# Patient Record
Sex: Female | Born: 1985 | Race: Black or African American | Hispanic: No | Marital: Single | Smoking: Never smoker
Health system: Southern US, Community
[De-identification: ages and names within clinical notes are randomized; demographics above are authoritative.]

## PROBLEM LIST (undated history)

## (undated) ENCOUNTER — Inpatient Hospital Stay (HOSPITAL_COMMUNITY): Payer: Self-pay

## (undated) DIAGNOSIS — R51 Headache: Secondary | ICD-10-CM

## (undated) DIAGNOSIS — Z8619 Personal history of other infectious and parasitic diseases: Secondary | ICD-10-CM

## (undated) DIAGNOSIS — R519 Headache, unspecified: Secondary | ICD-10-CM

## (undated) DIAGNOSIS — Z87898 Personal history of other specified conditions: Secondary | ICD-10-CM

## (undated) DIAGNOSIS — F1291 Cannabis use, unspecified, in remission: Secondary | ICD-10-CM

## (undated) DIAGNOSIS — R87629 Unspecified abnormal cytological findings in specimens from vagina: Secondary | ICD-10-CM

## (undated) HISTORY — PX: THERAPEUTIC ABORTION: SHX798

## (undated) HISTORY — PX: TONSILLECTOMY AND ADENOIDECTOMY: SHX28

## (undated) HISTORY — PX: KNEE ARTHROCENTESIS: SUR44

---

## 2007-03-13 ENCOUNTER — Other Ambulatory Visit: Admission: RE | Admit: 2007-03-13 | Discharge: 2007-03-13 | Payer: Self-pay | Admitting: Gynecology

## 2007-07-04 ENCOUNTER — Other Ambulatory Visit: Admission: RE | Admit: 2007-07-04 | Discharge: 2007-07-04 | Payer: Self-pay | Admitting: Gynecology

## 2007-12-03 ENCOUNTER — Other Ambulatory Visit: Admission: RE | Admit: 2007-12-03 | Discharge: 2007-12-03 | Payer: Self-pay | Admitting: Gynecology

## 2008-04-17 ENCOUNTER — Inpatient Hospital Stay (HOSPITAL_COMMUNITY): Admission: AD | Admit: 2008-04-17 | Discharge: 2008-04-17 | Payer: Self-pay | Admitting: Obstetrics & Gynecology

## 2009-10-09 ENCOUNTER — Emergency Department (HOSPITAL_COMMUNITY): Admission: EM | Admit: 2009-10-09 | Discharge: 2009-10-10 | Payer: Self-pay | Admitting: Emergency Medicine

## 2010-06-07 LAB — CBC
HCT: 40.8 % (ref 36.0–46.0)
Hemoglobin: 13.2 g/dL (ref 12.0–15.0)
MCHC: 32.5 g/dL (ref 30.0–36.0)
MCV: 89.6 fL (ref 78.0–100.0)
RBC: 4.55 MIL/uL (ref 3.87–5.11)
RDW: 13.8 % (ref 11.5–15.5)
WBC: 13.6 10*3/uL — ABNORMAL HIGH (ref 4.0–10.5)

## 2010-06-07 LAB — WET PREP, GENITAL: Yeast Wet Prep HPF POC: NONE SEEN

## 2010-06-07 LAB — POCT PREGNANCY, URINE: Preg Test, Ur: POSITIVE

## 2010-06-07 LAB — URINALYSIS, ROUTINE W REFLEX MICROSCOPIC
Bilirubin Urine: NEGATIVE
Glucose, UA: NEGATIVE mg/dL
Hgb urine dipstick: NEGATIVE
Protein, ur: NEGATIVE mg/dL
Specific Gravity, Urine: 1.025 (ref 1.005–1.030)
pH: 5.5 (ref 5.0–8.0)

## 2010-06-07 LAB — GC/CHLAMYDIA PROBE AMP, GENITAL: GC Probe Amp, Genital: NEGATIVE

## 2011-07-10 ENCOUNTER — Other Ambulatory Visit: Payer: Self-pay | Admitting: Gynecology

## 2011-07-10 DIAGNOSIS — N63 Unspecified lump in unspecified breast: Secondary | ICD-10-CM

## 2011-07-11 ENCOUNTER — Ambulatory Visit
Admission: RE | Admit: 2011-07-11 | Discharge: 2011-07-11 | Disposition: A | Discharge status: Home / Self Care | Payer: BC Managed Care – PPO | Source: Ambulatory Visit | Attending: Gynecology | Admitting: Gynecology

## 2011-07-11 DIAGNOSIS — N63 Unspecified lump in unspecified breast: Secondary | ICD-10-CM

## 2011-07-21 ENCOUNTER — Ambulatory Visit: Payer: BC Managed Care – PPO | Admitting: Family Medicine

## 2011-07-21 VITALS — BP 128/78 | HR 96 | Temp 98.7°F | Resp 20 | Ht 66.0 in | Wt 235.0 lb

## 2011-07-21 DIAGNOSIS — E669 Obesity, unspecified: Secondary | ICD-10-CM

## 2011-07-21 DIAGNOSIS — Z0000 Encounter for general adult medical examination without abnormal findings: Secondary | ICD-10-CM

## 2011-07-21 LAB — POCT CBC
Granulocyte percent: 42.9 %G (ref 37–80)
MCHC: 31.3 g/dL — AB (ref 31.8–35.4)
MID (cbc): 0.6 (ref 0–0.9)
MPV: 12.6 fL (ref 0–99.8)
RDW, POC: 13 %
WBC: 9.6 10*3/uL (ref 4.6–10.2)

## 2011-07-21 MED ORDER — NABUMETONE 750 MG PO TABS: 750.0000 mg | ORAL_TABLET | Freq: Two times a day (BID) | ORAL | Status: AC

## 2011-07-21 NOTE — Progress Notes (Signed)
  Subjective: Patient is here for physical examination. Please refer to her patient history sheet which we had scanned into this record which has her past medical history family history social history review of systems all documented. I reviewed this all with the patient.  Her left ankle is been hurting her on the top of the foot actually. She says it 1 para shoes bothers her the most on that. She works long hours standing on a hard floor.  Objective: Obese Afro-American female in no acute distress this time. She is pleasant and oriented. Her TMs are normal. Eyes PERRLA. Fundi benign. Throat clear. Neck supple without nodes or thyromegaly. No carotid bruits. Chest clear to auscultation. Heart has a grade 1-2/6 systolic murmur heard on the right upper sternal border and left upper sternal border. Appear both seated and laying down. Heart was regular. Abdomen soft and mass or tenderness. No ankle edema. Extremities are otherwise unremarkable. She is a little tender area anterior to the medial malleolus of the left foot.    Results for orders placed in visit on 07/21/11  POCT CBC      Component Value Range   WBC 9.6  4.6 - 10.2 (K/uL)   Lymph, poc 4.9 (*) 0.6 - 3.4    POC LYMPH PERCENT 50.8 (*) 10 - 50 (%L)   MID (cbc) 0.6  0 - 0.9    POC MID % 6.3  0 - 12 (%M)   POC Granulocyte 4.1  2 - 6.9    Granulocyte percent 42.9  37 - 80 (%G)   RBC 4.86  4.04 - 5.48 (M/uL)   Hemoglobin 13.7  12.2 - 16.2 (g/dL)   HCT, POC 82.9  56.2 - 47.9 (%)   MCV 90.1  80 - 97 (fL)   MCH, POC 28.2  27 - 31.2 (pg)   MCHC 31.3 (*) 31.8 - 35.4 (g/dL)   RDW, POC 13.0     Platelet Count, POC 189  142 - 424 (K/uL)   MPV 12.6  0 - 99.8 (fL)   Other labs are pending:  Assessment: Complete physical examination Obesity Systolic murmur Foot pain  Plan: Relafen 750 twice a day for the foot pain. Advised her heart recheck in 3 months to see if the murmur is still there. It may just be a transient thing. If it is  persisting we'll have to get echo on her. I think it is benign. Discussed with her the need for weight loss and how to work on going about it. She needs to get honest with himself, decreased oral intake, and increased exercise.

## 2011-07-21 NOTE — Patient Instructions (Signed)
Consider wearing arch supports.  Work hard on weight loss.  Return if problems persist.

## 2011-07-22 LAB — LIPID PANEL
Cholesterol: 200 mg/dL (ref 0–200)
HDL: 51 mg/dL (ref >39)
VLDL: 22 mg/dL (ref 0–40)

## 2011-07-22 LAB — COMPREHENSIVE METABOLIC PANEL
Alkaline Phosphatase: 58 U/L (ref 39–117)
BUN: 14 mg/dL (ref 6–23)
Chloride: 105 mEq/L (ref 96–112)
Creat: 0.85 mg/dL (ref 0.50–1.10)
Glucose, Bld: 80 mg/dL (ref 70–99)
Total Protein: 7.4 g/dL (ref 6.0–8.3)

## 2011-07-24 ENCOUNTER — Encounter (INDEPENDENT_AMBULATORY_CARE_PROVIDER_SITE_OTHER): Payer: BC Managed Care – PPO

## 2011-07-24 DIAGNOSIS — Z111 Encounter for screening for respiratory tuberculosis: Secondary | ICD-10-CM

## 2011-07-25 ENCOUNTER — Encounter: Payer: Self-pay | Admitting: Family Medicine

## 2011-07-29 ENCOUNTER — Encounter: Payer: Self-pay | Admitting: Family Medicine
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
## 2014-11-16 LAB — OB RESULTS CONSOLE ABO/RH: RH TYPE: POSITIVE

## 2014-11-16 LAB — OB RESULTS CONSOLE HEPATITIS B SURFACE ANTIGEN: Hepatitis B Surface Ag: NEGATIVE

## 2014-11-16 LAB — OB RESULTS CONSOLE RPR: RPR: NONREACTIVE

## 2014-11-16 LAB — OB RESULTS CONSOLE GC/CHLAMYDIA
CHLAMYDIA, DNA PROBE: NEGATIVE
Gonorrhea: NEGATIVE

## 2014-11-16 LAB — OB RESULTS CONSOLE RUBELLA ANTIBODY, IGM: Rubella: IMMUNE

## 2014-11-16 LAB — OB RESULTS CONSOLE HIV ANTIBODY (ROUTINE TESTING): HIV: NONREACTIVE

## 2014-11-16 LAB — OB RESULTS CONSOLE ANTIBODY SCREEN: Antibody Screen: NEGATIVE

## 2015-01-24 ENCOUNTER — Encounter (HOSPITAL_COMMUNITY): Payer: Self-pay

## 2015-01-24 ENCOUNTER — Inpatient Hospital Stay (HOSPITAL_COMMUNITY)
Admission: AD | Admit: 2015-01-24 | Discharge: 2015-01-24 | Disposition: A | Discharge status: Home / Self Care | Payer: Medicaid Other | Source: Ambulatory Visit | Attending: Obstetrics & Gynecology | Admitting: Obstetrics & Gynecology

## 2015-01-24 DIAGNOSIS — R197 Diarrhea, unspecified: Secondary | ICD-10-CM | POA: Diagnosis not present

## 2015-01-24 DIAGNOSIS — A599 Trichomoniasis, unspecified: Secondary | ICD-10-CM

## 2015-01-24 DIAGNOSIS — O210 Mild hyperemesis gravidarum: Secondary | ICD-10-CM | POA: Diagnosis not present

## 2015-01-24 DIAGNOSIS — O26892 Other specified pregnancy related conditions, second trimester: Secondary | ICD-10-CM | POA: Diagnosis present

## 2015-01-24 DIAGNOSIS — Z3A20 20 weeks gestation of pregnancy: Secondary | ICD-10-CM | POA: Diagnosis not present

## 2015-01-24 DIAGNOSIS — R110 Nausea: Secondary | ICD-10-CM

## 2015-01-24 DIAGNOSIS — T8859XA Other complications of anesthesia, initial encounter: Secondary | ICD-10-CM

## 2015-01-24 HISTORY — DX: Headache, unspecified: R51.9

## 2015-01-24 HISTORY — DX: Headache: R51

## 2015-01-24 LAB — URINALYSIS, ROUTINE W REFLEX MICROSCOPIC
Bilirubin Urine: NEGATIVE
GLUCOSE, UA: NEGATIVE mg/dL
Hgb urine dipstick: NEGATIVE
Ketones, ur: NEGATIVE mg/dL
NITRITE: NEGATIVE
PH: 6 (ref 5.0–8.0)
Protein, ur: NEGATIVE mg/dL
Specific Gravity, Urine: 1.025 (ref 1.005–1.030)

## 2015-01-24 LAB — URINE MICROSCOPIC-ADD ON

## 2015-01-24 MED ORDER — METRONIDAZOLE 500 MG PO TABS
2000.0000 mg | ORAL_TABLET | Freq: Once | ORAL | Status: AC
  Administered 2015-01-24: 2000 mg via ORAL
  Filled 2015-01-24: qty 4

## 2015-01-24 MED ORDER — ONDANSETRON HCL 4 MG PO TABS
4.0000 mg | ORAL_TABLET | Freq: Once | ORAL | Status: AC
  Administered 2015-01-24: 4 mg via ORAL
  Filled 2015-01-24: qty 1

## 2015-01-24 MED ORDER — ONDANSETRON 4 MG PO TBDP: 4.0000 mg | ORAL_TABLET | Freq: Three times a day (TID) | ORAL | Status: DC | PRN

## 2015-01-24 NOTE — MAU Provider Note (Signed)
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  Patient reports onset of nausea, vomiting, and stomach ache at 0100.  Patient reports this occurred every 2 hours until about 8 am allowing rest until 4pm. Patient states she ate some crackers and sprite then had some loose stools.  Patient states she felt "really warm" and had chills and decided it was best to present.  Patient reports some bilateral abdominal pain that is present only with vomiting.  Patient also reports mild headache and states "I can feel it coming."  Patient denies LOF, VB, and Ctx.  Reports active fetus and denies issues with urination.  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    There are no active problems to display for this patient.   Chief Complaint  Patient presents with  . Nausea  . Emesis During Pregnancy  . Diarrhea  . Fever   HPI  OB History    Gravida Para Term Preterm AB TAB SAB Ectopic Multiple Living   1               Past Medical History  Diagnosis Date  . Headache     Past Surgical History  Procedure Laterality Date  . Tonsillectomy and adenoidectomy    . Knee arthrocentesis    . Therapeutic abortion      Family History  Problem Relation Age of Onset  . Diabetes Mother   . Hypertension Brother     Social History  Substance Use Topics  . Smoking status: Never Smoker   . Smokeless tobacco: None  . Alcohol Use: Yes     Comment: last use Aug 2016    Allergies: No Known Allergies  Prescriptions prior to admission  Medication Sig Dispense Refill Last Dose  . metroNIDAZOLE (FLAGYL) 500 MG tablet Take by mouth.   Taking    Review of Systems  Constitutional: Positive for chills.  HENT: Negative for congestion, ear pain and sore throat.   Eyes: Positive for redness. Negative for blurred vision, double vision and pain.   Respiratory: Positive for cough.   Gastrointestinal: Positive for nausea, vomiting, abdominal pain and diarrhea.  Genitourinary: Negative for dysuria.  Musculoskeletal: Positive for myalgias.  Neurological: Positive for headaches. Negative for dizziness.    See HPI Above Physical Exam   Blood pressure 131/67, pulse 86, temperature 98.2 F (36.8 C), temperature source Oral, resp. rate 18, height  (1.651 m), weight 115.667 kg (255 lb), last menstrual period 09/01/2014, SpO2 99 %.  Results for orders placed or performed during the hospital encounter of 01/24/15 (from the past 24 hour(s))  Urinalysis, Routine w reflex microscopic (not at [facility])     Status: Abnormal   Collection Time: 01/24/15  9:00 PM  Result Value Ref Range   Color, Urine YELLOW YELLOW   APPearance CLEAR CLEAR   Specific Gravity, Urine 1.025 1.005 - 1.030   pH 6.0 5.0 - 8.0   Glucose, UA NEGATIVE NEGATIVE mg/dL   Hgb urine dipstick NEGATIVE NEGATIVE   Bilirubin Urine NEGATIVE NEGATIVE   Ketones, ur NEGATIVE NEGATIVE mg/dL   Protein, ur NEGATIVE NEGATIVE mg/dL   Nitrite NEGATIVE NEGATIVE   Leukocytes, UA SMALL (A) NEGATIVE  Urine microscopic-add on     Status: Abnormal   Collection Time: 01/24/15  9:00 PM  Result Value  Ref Range   Squamous Epithelial / LPF 6-30 (A) NONE SEEN   WBC, UA 0-5 0 - 5 WBC/hpf   RBC / HPF 0-5 0 - 5 RBC/hpf   Bacteria, UA RARE (A) NONE SEEN   Urine-Other TRICHOMONAS PRESENT     Physical Exam  Constitutional: She is oriented to person, place, and time. She appears well-developed and well-nourished.  HENT:  Head: Normocephalic and atraumatic.  Eyes: EOM are normal. Pupils are equal, round, and reactive to light.  Neck: Normal range of motion.  Cardiovascular: Normal rate, regular rhythm and normal heart sounds.   Respiratory: Effort normal and breath sounds normal.  GI: Soft. Bowel sounds are normal. There is no tenderness.  Musculoskeletal: Normal range of motion.  Neurological:  She is alert and oriented to person, place, and time.  Skin: Skin is warm and dry.  Cheeks appear flush     FHR: 145 by doppler UC: None palpated ED Course  Assessment: IUP at 20.5wks N/V Diarrhea  Plan: -Discussed UA findings and need for treatment; patient without concerns.  Questions where partner should receive treatment -Zofran 4mg  PO -Metronidazole 2g PO now -Push oral fluids and intake (i.e. Crackers) -Will reassess in one hour  Follow Up (2315) -Reports improvement with PO zofran, fluids, and crackers -Rx for Zofran 4mg  PO Q8hrs, prn Disp 20, RF 0 -Encouraged to call if any questions or concerns arise prior to next scheduled office visit.  -Keep appt as scheduled: 02/10/2015 -Discharged to home in improved condition  Cherre RobinsJessica L Alpheus Stiff CNM, MSN 01/24/2015 10:03 PM

## 2015-01-24 NOTE — MAU Note (Signed)
Pt states that she has been vomiting since 1am. Started having diarrhea about 5pm. Had 2 watery stools and 1 loose stool. Thinks she may have a fever, but does not have a thermometer. Has tried sipping on ginger ale and ice chips and eating saltines-made her feel worse.

## 2015-01-24 NOTE — Discharge Instructions (Signed)
Trichomoniasis Trichomoniasis is an infection caused by an organism called Trichomonas. The infection can affect both women and men. In women, the outer female genitalia and the vagina are affected. In men, the penis is mainly affected, but the prostate and other reproductive organs can also be involved. Trichomoniasis is a sexually transmitted infection (STI) and is most often passed to another person through sexual contact.  RISK FACTORS  Having unprotected sexual intercourse.  Having sexual intercourse with an infected partner. SIGNS AND SYMPTOMS  Symptoms of trichomoniasis in women include:  Abnormal gray-green frothy vaginal discharge.  Itching and irritation of the vagina.  Itching and irritation of the area outside the vagina. Symptoms of trichomoniasis in men include:   Penile discharge with or without pain.  Pain during urination. This results from inflammation of the urethra. DIAGNOSIS  Trichomoniasis may be found during a Pap test or physical exam. Your health care provider may use one of the following methods to help diagnose this infection:  Testing the pH of the vagina with a test tape.  Using a vaginal swab test that checks for the Trichomonas organism. A test is available that provides results within a few minutes.  Examining a urine sample.  Testing vaginal secretions. Your health care provider may test you for other STIs, including HIV. TREATMENT   You may be given medicine to fight the infection. Women should inform their health care provider if they could be or are pregnant. Some medicines used to treat the infection should not be taken during pregnancy.  Your health care provider may recommend over-the-counter medicines or creams to decrease itching or irritation.  Your sexual partner will need to be treated if infected.  Your health care provider may test you for infection again 3 months after treatment. HOME CARE INSTRUCTIONS   Take medicines only as  directed by your health care provider.  Take over-the-counter medicine for itching or irritation as directed by your health care provider.  Do not have sexual intercourse while you have the infection.  Women should not douche or wear tampons while they have the infection.  Discuss your infection with your partner. Your partner may have gotten the infection from you, or you may have gotten it from your partner.  Have your sex partner get examined and treated if necessary.  Practice safe, informed, and protected sex.  See your health care provider for other STI testing. SEEK MEDICAL CARE IF:   You still have symptoms after you finish your medicine.  You develop abdominal pain.  You have pain when you urinate.  You have bleeding after sexual intercourse.  You develop a rash.  Your medicine makes you sick or makes you throw up (vomit). MAKE SURE YOU:  Understand these instructions.  Will watch your condition.  Will get help right away if you are not doing well or get worse.   This information is not intended to replace advice given to you by your health care provider. Make sure you discuss any questions you have with your health care provider.   Document Released: 08/02/2000 Document Revised: 02/27/2014 Document Reviewed: 11/18/2012 Elsevier Interactive Patient Education 2016 [employer] Second Trimester of Pregnancy The second trimester is from week 13 through week 28, months 4 through 6. The second trimester is often a time when you feel your best. Your body has also adjusted to being pregnant, and you begin to feel better physically. Usually, morning sickness has lessened or quit completely, you may have more energy, and you  may have an increase in appetite. The second trimester is also a time when the fetus is growing rapidly. At the end of the sixth month, the fetus is about 9 inches long and weighs about 1 pounds. You will likely begin to feel the baby move (quickening)  between 18 and 20 weeks of the pregnancy. BODY CHANGES Your body goes through many changes during pregnancy. The changes vary from woman to woman.   Your weight will continue to increase. You will notice your lower abdomen bulging out.  You may begin to get stretch marks on your hips, abdomen, and breasts.  You may develop headaches that can be relieved by medicines approved by your health care provider.  You may urinate more often because the fetus is pressing on your bladder.  You may develop or continue to have heartburn as a result of your pregnancy.  You may develop constipation because certain hormones are causing the muscles that push waste through your intestines to slow down.  You may develop hemorrhoids or swollen, bulging veins (varicose veins).  You may have back pain because of the weight gain and pregnancy hormones relaxing your joints between the bones in your pelvis and as a result of a shift in weight and the muscles that support your balance.  Your breasts will continue to grow and be tender.  Your gums may bleed and may be sensitive to brushing and flossing.  Dark spots or blotches (chloasma, mask of pregnancy) may develop on your face. This will likely fade after the baby is born.  A dark line from your belly button to the pubic area (linea nigra) may appear. This will likely fade after the baby is born.  You may have changes in your hair. These can include thickening of your hair, rapid growth, and changes in texture. Some women also have hair loss during or after pregnancy, or hair that feels dry or thin. Your hair will most likely return to normal after your baby is born. WHAT TO EXPECT AT YOUR PRENATAL VISITS During a routine prenatal visit:  You will be weighed to make sure you and the fetus are growing normally.  Your blood pressure will be taken.  Your abdomen will be measured to track your baby's growth.  The fetal heartbeat will be listened  to.  Any test results from the previous visit will be discussed. Your health care provider may ask you:  How you are feeling.  If you are feeling the baby move.  If you have had any abnormal symptoms, such as leaking fluid, bleeding, severe headaches, or abdominal cramping.  If you are using any tobacco products, including cigarettes, chewing tobacco, and electronic cigarettes.  If you have any questions. Other tests that may be performed during your second trimester include:  Blood tests that check for:  Low iron levels (anemia).  Gestational diabetes (between 24 and 28 weeks).  Rh antibodies.  Urine tests to check for infections, diabetes, or protein in the urine.  An ultrasound to confirm the proper growth and development of the baby.  An amniocentesis to check for possible genetic problems.  Fetal screens for spina bifida and Down syndrome.  HIV (human immunodeficiency virus) testing. Routine prenatal testing includes screening for HIV, unless you choose not to have this test. HOME CARE INSTRUCTIONS   Avoid all smoking, herbs, alcohol, and unprescribed drugs. These chemicals affect the formation and growth of the baby.  Do not use any tobacco products, including cigarettes, chewing tobacco, and  electronic cigarettes. If you need help quitting, ask your health care provider. You may receive counseling support and other resources to help you quit.  Follow your health care provider's instructions regarding medicine use. There are medicines that are either safe or unsafe to take during pregnancy.  Exercise only as directed by your health care provider. Experiencing uterine cramps is a good sign to stop exercising.  Continue to eat regular, healthy meals.  Wear a good support bra for breast tenderness.  Do not use hot tubs, steam rooms, or saunas.  Wear your seat belt at all times when driving.  Avoid raw meat, uncooked cheese, cat litter boxes, and soil used by  cats. These carry germs that can cause birth defects in the baby.  Take your prenatal vitamins.  Take 1500-2000 mg of calcium daily starting at the 20th week of pregnancy until you deliver your baby.  Try taking a stool softener (if your health care provider approves) if you develop constipation. Eat more high-fiber foods, such as fresh vegetables or fruit and whole grains. Drink plenty of fluids to keep your urine clear or pale yellow.  Take warm sitz baths to soothe any pain or discomfort caused by hemorrhoids. Use hemorrhoid cream if your health care provider approves.  If you develop varicose veins, wear support hose. Elevate your feet for 15 minutes, 3-4 times a day. Limit salt in your diet.  Avoid heavy lifting, wear low heel shoes, and practice good posture.  Rest with your legs elevated if you have leg cramps or low back pain.  Visit your dentist if you have not gone yet during your pregnancy. Use a soft toothbrush to brush your teeth and be gentle when you floss.  A sexual relationship may be continued unless your health care provider directs you otherwise.  Continue to go to all your prenatal visits as directed by your health care provider. SEEK MEDICAL CARE IF:   You have dizziness.  You have mild pelvic cramps, pelvic pressure, or nagging pain in the abdominal area.  You have persistent nausea, vomiting, or diarrhea.  You have a bad smelling vaginal discharge.  You have pain with urination. SEEK IMMEDIATE MEDICAL CARE IF:   You have a fever.  You are leaking fluid from your vagina.  You have spotting or bleeding from your vagina.  You have severe abdominal cramping or pain.  You have rapid weight gain or loss.  You have shortness of breath with chest pain.  You notice sudden or extreme swelling of your face, hands, ankles, feet, or legs.  You have not felt your baby move in over an hour.  You have severe headaches that do not go away with medicine.  You  have vision changes.   This information is not intended to replace advice given to you by your health care provider. Make sure you discuss any questions you have with your health care provider.   Document Released: 01/31/2001 Document Revised: 02/27/2014 Document Reviewed: 04/09/2012 Elsevier Interactive Patient Education [employer]

## 2015-02-21 NOTE — L&D Delivery Note (Addendum)
Delivery Note 0447:  Nurse call requests immediate provider assessment.  In room to assess.  Reports patient C/C/ +2 with increased descent after pushing for 5 minutes.  Neonatology team en route and patient encouraged to continue pushing with contractions. Patient delivered as below with staff and family support.  At 4:55 AM, on June 14, 2015, a viable female "Maureen Lynch" was delivered via Vaginal, Spontaneous Delivery (Presentation: Occiput Anterior with restitution to LOP).  Shoulders delivered easily and infant with flaccid tone and minimal grimace. Tactile stimulation given by provider, while nurse provided bulb suction.  Infant placed on mother's abdomen where nurse continued tactile stimulation and cord was cut.  Infant handed to neonatologist Ruben Gottron(McCrae Smith, MD) for assessment and APGAR: 6, 7.  Cord blood collected and placenta delivered spontaneously.  Placenta noted to be intact with 3VC upon inspection. Vaginal inspection revealed a right periurethral laceration that extended into the periclitoris in addition to a left labial and vaginal lacerations. The lacerations was repaired with 3-0 vicryl on SH and CT-1, respectively.  ~348mL of 1% Lidocaine was utilized and patient tolerated the procedure well. Fundus firm, at the umbilicus, and bleeding small.  Mother hemodynamically stable and infant skin to skin prior to provider exit.  Mother desires condoms for birth control and opts to breastfeed.   Family wishes for infant to be circumcised in outpatient setting. Infant weight at one hour of life: 7lbs 12oz, 21in  Anesthesia: Epidural, Local for Repair  Episiotomy: None Lacerations: Right Periurethral with Extension into Periclitoris;Left Labial;Vaginal Suture Repair: 3.0 vicryl Est. Blood Loss (mL):  325  Mom to postpartum.  Baby to Couplet care / Skin to Skin.  Maureen RobinsJessica L Jatin Naumann MSN, CNM 06/14/2015, 5:42 AM

## 2015-05-11 LAB — OB RESULTS CONSOLE GC/CHLAMYDIA
Chlamydia: NEGATIVE
Gonorrhea: NEGATIVE

## 2015-05-11 LAB — OB RESULTS CONSOLE GBS: GBS: NEGATIVE

## 2015-06-13 ENCOUNTER — Inpatient Hospital Stay (HOSPITAL_COMMUNITY)
Admission: AD | Admit: 2015-06-13 | Discharge: 2015-06-16 | DRG: 775 | Disposition: A | Discharge status: Home / Self Care | Payer: Medicaid Other | Source: Ambulatory Visit | Attending: Obstetrics and Gynecology | Admitting: Obstetrics and Gynecology

## 2015-06-13 ENCOUNTER — Encounter (HOSPITAL_COMMUNITY): Payer: Self-pay

## 2015-06-13 ENCOUNTER — Inpatient Hospital Stay (HOSPITAL_COMMUNITY): Payer: Medicaid Other | Admitting: Anesthesiology

## 2015-06-13 ENCOUNTER — Encounter (HOSPITAL_COMMUNITY): Payer: Self-pay | Admitting: *Deleted

## 2015-06-13 ENCOUNTER — Inpatient Hospital Stay (HOSPITAL_COMMUNITY)
Admission: AD | Admit: 2015-06-13 | Discharge: 2015-06-13 | Disposition: A | Discharge status: Home / Self Care | Payer: Medicaid Other | Source: Ambulatory Visit | Attending: Obstetrics and Gynecology | Admitting: Obstetrics and Gynecology

## 2015-06-13 DIAGNOSIS — O99214 Obesity complicating childbirth: Secondary | ICD-10-CM | POA: Diagnosis present

## 2015-06-13 DIAGNOSIS — O480 Post-term pregnancy: Principal | ICD-10-CM | POA: Diagnosis present

## 2015-06-13 DIAGNOSIS — O479 False labor, unspecified: Secondary | ICD-10-CM

## 2015-06-13 DIAGNOSIS — Z8249 Family history of ischemic heart disease and other diseases of the circulatory system: Secondary | ICD-10-CM

## 2015-06-13 DIAGNOSIS — Z6841 Body Mass Index (BMI) 40.0 and over, adult: Secondary | ICD-10-CM

## 2015-06-13 DIAGNOSIS — Z3A40 40 weeks gestation of pregnancy: Secondary | ICD-10-CM

## 2015-06-13 DIAGNOSIS — Z833 Family history of diabetes mellitus: Secondary | ICD-10-CM | POA: Diagnosis not present

## 2015-06-13 HISTORY — DX: Personal history of other specified conditions: Z87.898

## 2015-06-13 HISTORY — DX: Unspecified abnormal cytological findings in specimens from vagina: R87.629

## 2015-06-13 HISTORY — DX: Personal history of other infectious and parasitic diseases: Z86.19

## 2015-06-13 HISTORY — DX: Cannabis use, unspecified, in remission: F12.91

## 2015-06-13 LAB — CBC
HCT: 39.5 % (ref 36.0–46.0)
HEMOGLOBIN: 13.2 g/dL (ref 12.0–15.0)
MCH: 28.6 pg (ref 26.0–34.0)
MCHC: 33.4 g/dL (ref 30.0–36.0)
MCV: 85.7 fL (ref 78.0–100.0)
Platelets: 253 10*3/uL (ref 150–400)
RBC: 4.61 MIL/uL (ref 3.87–5.11)
RDW: 15.4 % (ref 11.5–15.5)
WBC: 16.4 10*3/uL — ABNORMAL HIGH (ref 4.0–10.5)

## 2015-06-13 LAB — TYPE AND SCREEN
ABO/RH(D): A POS
Antibody Screen: NEGATIVE

## 2015-06-13 MED ORDER — FENTANYL 2.5 MCG/ML BUPIVACAINE 1/10 % EPIDURAL INFUSION (WH - ANES)
14.0000 mL/h | INTRAMUSCULAR | Status: DC | PRN
  Administered 2015-06-13: 14 mL/h via EPIDURAL

## 2015-06-13 MED ORDER — LACTATED RINGERS IV SOLN
INTRAVENOUS | Status: DC
  Administered 2015-06-14: 150 mL/h via INTRAUTERINE

## 2015-06-13 MED ORDER — LIDOCAINE HCL (PF) 1 % IJ SOLN
INTRAMUSCULAR | Status: DC | PRN
  Administered 2015-06-13 (×2): 5 mL via EPIDURAL

## 2015-06-13 MED ORDER — ACETAMINOPHEN 325 MG PO TABS: 650.0000 mg | ORAL_TABLET | ORAL | Status: DC | PRN

## 2015-06-13 MED ORDER — CITRIC ACID-SODIUM CITRATE 334-500 MG/5ML PO SOLN: 30.0000 mL | ORAL | Status: DC | PRN

## 2015-06-13 MED ORDER — LACTATED RINGERS IV SOLN: 500.0000 mL | Freq: Once | INTRAVENOUS | Status: DC

## 2015-06-13 MED ORDER — EPHEDRINE 5 MG/ML INJ
10.0000 mg | INTRAVENOUS | Status: DC | PRN
  Filled 2015-06-13: qty 2

## 2015-06-13 MED ORDER — OXYTOCIN 10 UNIT/ML IJ SOLN
2.5000 [IU]/h | INTRAVENOUS | Status: DC
  Filled 2015-06-13: qty 4

## 2015-06-13 MED ORDER — PHENYLEPHRINE 40 MCG/ML (10ML) SYRINGE FOR IV PUSH (FOR BLOOD PRESSURE SUPPORT)
80.0000 ug | PREFILLED_SYRINGE | INTRAVENOUS | Status: DC | PRN
  Filled 2015-06-13: qty 2

## 2015-06-13 MED ORDER — FENTANYL 2.5 MCG/ML BUPIVACAINE 1/10 % EPIDURAL INFUSION (WH - ANES)
INTRAMUSCULAR | Status: AC
  Filled 2015-06-13: qty 125

## 2015-06-13 MED ORDER — PHENYLEPHRINE 40 MCG/ML (10ML) SYRINGE FOR IV PUSH (FOR BLOOD PRESSURE SUPPORT): 80.0000 ug | PREFILLED_SYRINGE | INTRAVENOUS | Status: DC | PRN

## 2015-06-13 MED ORDER — ONDANSETRON HCL 4 MG/2ML IJ SOLN: 4.0000 mg | Freq: Four times a day (QID) | INTRAMUSCULAR | Status: DC | PRN

## 2015-06-13 MED ORDER — LACTATED RINGERS IV SOLN
INTRAVENOUS | Status: DC
  Administered 2015-06-13: 19:00:00 via INTRAVENOUS
  Administered 2015-06-13 – 2015-06-14 (×2): 125 mL/h via INTRAVENOUS

## 2015-06-13 MED ORDER — LACTATED RINGERS IV SOLN
500.0000 mL | Freq: Once | INTRAVENOUS | Status: AC
  Administered 2015-06-13: 500 mL via INTRAVENOUS

## 2015-06-13 MED ORDER — FENTANYL CITRATE (PF) 100 MCG/2ML IJ SOLN: 50.0000 ug | INTRAMUSCULAR | Status: DC | PRN

## 2015-06-13 MED ORDER — OXYCODONE-ACETAMINOPHEN 5-325 MG PO TABS: 2.0000 | ORAL_TABLET | ORAL | Status: DC | PRN

## 2015-06-13 MED ORDER — OXYCODONE-ACETAMINOPHEN 5-325 MG PO TABS: 1.0000 | ORAL_TABLET | ORAL | Status: DC | PRN

## 2015-06-13 MED ORDER — LIDOCAINE HCL (PF) 1 % IJ SOLN
30.0000 mL | INTRAMUSCULAR | Status: AC | PRN
  Administered 2015-06-14: 30 mL via SUBCUTANEOUS
  Filled 2015-06-13: qty 30

## 2015-06-13 MED ORDER — PHENYLEPHRINE 40 MCG/ML (10ML) SYRINGE FOR IV PUSH (FOR BLOOD PRESSURE SUPPORT)
PREFILLED_SYRINGE | INTRAVENOUS | Status: AC
  Filled 2015-06-13: qty 20

## 2015-06-13 MED ORDER — DIPHENHYDRAMINE HCL 50 MG/ML IJ SOLN: 12.5000 mg | INTRAMUSCULAR | Status: DC | PRN

## 2015-06-13 MED ORDER — FLEET ENEMA 7-19 GM/118ML RE ENEM: 1.0000 | ENEMA | RECTAL | Status: DC | PRN

## 2015-06-13 MED ORDER — OXYTOCIN BOLUS FROM INFUSION
500.0000 mL | INTRAVENOUS | Status: DC
  Administered 2015-06-14: 500 mL via INTRAVENOUS

## 2015-06-13 MED ORDER — LACTATED RINGERS IV SOLN
500.0000 mL | INTRAVENOUS | Status: DC | PRN
  Administered 2015-06-13: 500 mL via INTRAVENOUS

## 2015-06-13 MED ORDER — EPHEDRINE 5 MG/ML INJ: 10.0000 mg | INTRAVENOUS | Status: DC | PRN

## 2015-06-13 NOTE — MAU Provider Note (Signed)
History    Maureen Lynch is a 30y.o. G1P0 at 40.5wks who presents unannounced for contractions.  Patient states contractions started Friday at 1030 and has been intermittent.  Patient states they became every 5-10 minutes starting at noon today.  Patient denies LOF, VB, and endorses active fetus.  Patient states GBS is negative and is scheduled for IOL on Wednesday.  Patient further states she was a fingertip in the office on Thursday.  Patient does not desire pain medication currently or while in labor.   There are no active problems to display for this patient.   Chief Complaint  Patient presents with  . Contractions   HPI  OB History    Gravida Para Term Preterm AB TAB SAB Ectopic Multiple Living   1               Past Medical History  Diagnosis Date  . Headache     Past Surgical History  Procedure Laterality Date  . Tonsillectomy and adenoidectomy    . Knee arthrocentesis    . Therapeutic abortion      Family History  Problem Relation Age of Onset  . Diabetes Mother   . Hypertension Brother     Social History  Substance Use Topics  . Smoking status: Never Smoker   . Smokeless tobacco: None  . Alcohol Use: Yes     Comment: last use Aug 2016    Allergies: No Known Allergies  Prescriptions prior to admission  Medication Sig Dispense Refill Last Dose  . ondansetron (ZOFRAN ODT) 4 MG disintegrating tablet Take 1 tablet (4 mg total) by mouth every 8 (eight) hours as needed for nausea or vomiting. 20 tablet 0     ROS  See HPI Above Physical Exam   Blood pressure 128/72, pulse 99, temperature 98.3 F (36.8 C), temperature source Oral, resp. rate 18, last menstrual period 09/01/2014.  No results found for this or any previous visit (from the past 24 hour(s)).  Physical Exam  Constitutional: She is oriented to person, place, and time. She appears well-developed and well-nourished.  HENT:  Head: Normocephalic and atraumatic.  Eyes: Conjunctivae are normal.   Neck: Normal range of motion.  Cardiovascular: Normal rate.   Respiratory: Effort normal.  GI: Soft.  Genitourinary:  VE: 0.5/50/Ballotable/Soft  Musculoskeletal: Normal range of motion.  Neurological: She is alert and oriented to person, place, and time.  Skin: Skin is warm and dry.    FHR: 125 bpm, Mod Var, +Variable Decel x 1-Self Resolved, +Accels UC: 3 contractions graphed  ED Course  Assessment: IUP at 40.5wks Cat I FT Contractions  Plan: -Labor Precautions -Discussed 5/1/1 Rule -IOL scheduled for Wednesday 06/16/15 -FKC Discussed -Keep appt as scheduled: 4/25 -Encouraged to call if any questions or concerns arise prior to next scheduled office visit.  -Discharged to home in stable condition  Maureen Lynch CNM, MSN 06/13/2015 1:57 AM

## 2015-06-13 NOTE — Anesthesia Preprocedure Evaluation (Signed)
Anesthesia Evaluation  Patient identified by MRN, date of birth, ID band Patient awake    Reviewed: Allergy & Precautions, H&P , NPO status , Patient's Chart, lab work & pertinent test results  Airway Mallampati: II  TM Distance: >3 FB Neck ROM: full    Dental no notable dental hx. (+) Dental Advisory Given, Teeth Intact   Pulmonary neg pulmonary ROS,    Pulmonary exam normal breath sounds clear to auscultation       Cardiovascular Exercise Tolerance: Good negative cardio ROS Normal cardiovascular exam Rhythm:regular Rate:Normal     Neuro/Psych negative neurological ROS  negative psych ROS   GI/Hepatic negative GI ROS, Neg liver ROS,   Endo/Other  negative endocrine ROSMorbid obesity  Renal/GU negative Renal ROS  negative genitourinary   Musculoskeletal   Abdominal (+) + obese,   Peds  Hematology negative hematology ROS (+)   Anesthesia Other Findings   Reproductive/Obstetrics negative OB ROS (+) Pregnancy                             Anesthesia Physical Anesthesia Plan  ASA: III  Anesthesia Plan: Epidural   Post-op Pain Management:    Induction:   Airway Management Planned:   Additional Equipment:   Intra-op Plan:   Post-operative Plan:   Informed Consent: I have reviewed the patients History and Physical, chart, labs and discussed the procedure including the risks, benefits and alternatives for the proposed anesthesia with the patient or authorized representative who has indicated his/her understanding and acceptance.   Dental Advisory Given  Plan Discussed with: CRNA  Anesthesia Plan Comments:         Anesthesia Quick Evaluation

## 2015-06-13 NOTE — H&P (Signed)
Maureen Lynch is a 30 y.o. female, G1P0000 at 40.5 weeks, presenting for contraction increasing in frequency and intensity.  Pt is scheduled for a 41 wk postdates induction for Wednesday  06/16/15.   BMI 47.7  EFW; 3607g (7lbs 15oz)  At 40.1wks  On 06/09/15   Patient Active Problem List   Diagnosis Date Noted  . Normal labor 06/13/2015  . Morbid obesity with BMI of 45.0-49.9, adult (HCC) 06/13/2015    History of present pregnancy: Patient entered care at 12.1 weeks.   EDC of 06/08/15 was established by LMP of 09/01/14.     Anatomy scan:  19.1 weeks on 01/13/16, with normal findings and a posterior placenta.    Singleton pregnancy. Vertex presentation.  Posterior placenta. PLacental edge is 6.9 cm from internal os-  normal ). Cervix is closed. Fluid is normal ( vertical pocket=4.1cm). Profile/palate, nasal bone seen. Philtrum  seen. Open hands. 5th digit . Heel and feet seen. Ovaries and Adnexas are within normal limits. Additional [place] evaluations:   31 wks  Growth on 04/06/15: Singleton pregnancy. FHT's 133 bpm. Vertex pres. Posterior placenta. Fluid is WNL- AFI-  50th %tile. EFW= 1807g= 68th %tile. Cx meas: 3.8 cm. Limited visualization. Technically difficult scan due to  maternal body habitus. Adnexas/ovaries unremarkable. 36.6 wks growth on 05/17/15:   Singleton pregnancy. Vertex presentation. Posterior placenta. AFI is normal-60th%.  BPP 8/8 in 3 minutes. Cervix not measured-per protocol. Adnexas are unremarkable. Maureen Lynch 39.1 wks  For BPP  On 06/02/15 :Singleton pregnancy. Vertex presentation. Posterior placenta. AFI is normal-50th%,  EFW = 3130g, 75th% Adnexas are unremarkable. FHR-135 40.1 wk  Postdates on 06/09/15: Maureen Lynch pregnancy. Vertex presentation. Posterior placenta. AFI is normal-70th%,  BPP 8/8 in 3 minutes. Cervix not measured-per protocol. Adnexas are unremarkable. Maureen Lynch.  EFW; 3607g  (7lbs 15oz) , AFI 16.22 Significant prenatal events:  First Trimester:  UTI at 16wks, Tx with  Macrobid and  Yeast infection, tx with OTC Monistat Second Trimester: MAU visit at  20.5 wks for nausea, vomiting, hydration.  Positive for Maureen Lynch, [place]   Third Trimester:   Continues to have yeast infection including vulvar irritation, tx OTC creams with Antenatal testing for obesity at 32 weeks,  EFW for S>D,  Desired waterbirth but is not eligible due to BMI, C/o normal discomforts of pregnancy - back pain, contractions.   Last evaluation: [name]:    06/10/15 at    40.2 wks with A. Stefano Gaul, MD.   FH 39, FHR 144,   Vertex,  SVE: 1/20/-4 ; BP 130/70,  wgt 28lbs : INDUCTION AT 41 WEEKS DISCUSSED. DECLINED FOR NOW.    MAU:   06/13/15 by Sabas Sous, CNM Labor check :Seen for contractions. States ctx started on Friday and have been "every now and then," but also states they have been regular. Patient denies LOF and VB while endorsing good fetal movement. VE 0.5/50. D/C'd to home. Labor Precautions. Keep appts as scheduled.    OB History    Gravida Para Term Preterm AB TAB SAB Ectopic Multiple Living   1              Past Medical History  Diagnosis Date  . Headache   . Vaginal Pap smear, abnormal    Past Surgical History  Procedure Laterality Date  . Tonsillectomy and adenoidectomy    . Knee arthrocentesis    . Therapeutic abortion     Family History: family history includes Diabetes in her mother; Hypertension in her brother. Social History:  reports that  she has never smoked. She does not have any smokeless tobacco history on file. She reports that she drinks alcohol. She reports that she uses illicit drugs (Marijuana).  She is Tree surgeonAfrican American, Highest education 12 th grade, works as Building surveyorDaycare teacher,  Identifies as Control and instrumentation engineerBaptist. Is single and heterosexual     Prenatal Transfer Tool  Maternal Diabetes: No Genetic Screening: Normal Maternal Ultrasounds/Referrals: Normal Fetal Ultrasounds or other Referrals:  None Maternal Substance Abuse:  No Significant Maternal Medications:   None Significant Maternal Lab Results: Lab values include: Group B Strep negative  TDAP 03/12/15 Flu declines  ROS:  See above  No Known Allergies   Dilation: 3 Effacement (%): 90 Station: -1 Exam by:: R Willys Salvino CNM Blood pressure 135/71, pulse 78, temperature 98.6 F (37 C), temperature source Oral, resp. rate 18, last menstrual period 09/01/2014.  Chest clear Heart RRR without murmur Abd gravid, NT, FH 40 Ext: wnl  FHR: Category 1, 140 bpm,moderate variability, + accels, occasional early decelerations  UCs:  Regular every 5-6 minutes  Prenatal labs: ABO, Rh: A/Positive/-- (09/26 0000) Antibody: Negative (09/26 0000) Rubella:  !Error!    Immune RPR: Nonreactive (09/26 0000)  HBsAg: Negative (09/26 0000)  HIV: Non-reactive (09/26 0000)  GBS: Negative (03/21 0000) Sickle cell/Hgb electrophoresis:  %, normal study Pap:  Wnl, 11/25/14 GC:  Negative  11/16/15, 05/11/15 Chlamydia:  Negative  11/16/15 , 05/11/15 Genetic screenings:  negative Glucola:  wnl Other:   Hgb 13.3 at NOB, 11.8 at 28 weeks   Assessment/Plan: IUP at 40.5 wks Postdates Early Labor Membranes Intact Obesity, BMI 47.7 EFW 7lbs 15oz,  On 4/19, 40.1 wks GBS negative   [facility] with Dr[facility] Su Hiltoberts Routine [facility] orders Pain med/epidural prn Expectant management Report to oncoming provider, Sabas SousJ. Emly, CNM   Beatrix Fettersachel StallCNM, MSN 06/13/2015, 5:55 PM

## 2015-06-13 NOTE — Discharge Instructions (Signed)
Labor Induction Labor induction is when steps are taken to cause a pregnant woman to begin the labor process. Most women go into labor on their own between 37 weeks and 42 weeks of the pregnancy. When this does not happen or when there is a medical need, methods may be used to induce labor. Labor induction causes a pregnant woman's uterus to contract. It also causes the cervix to soften (ripen), open (dilate), and thin out (efface). Usually, labor is not induced before 39 weeks of the pregnancy unless there is a problem with the baby or mother.  Before inducing labor, your health care provider will consider a number of factors, including the following:  The medical condition of you and the baby.   How many weeks along you are.   The status of the baby's lung maturity.   The condition of the cervix.   The position of the baby.  WHAT ARE THE REASONS FOR LABOR INDUCTION? Labor may be induced for the following reasons:  The health of the baby or mother is at risk.   The pregnancy is overdue by 1 week or more.   The water breaks but labor does not start on its own.   The mother has a health condition or serious illness, such as high blood pressure, infection, placental abruption, or diabetes.  The amniotic fluid amounts are low around the baby.   The baby is distressed.  Convenience or wanting the baby to be born on a certain date is not a reason for inducing labor. WHAT METHODS ARE USED FOR LABOR INDUCTION? Several methods of labor induction may be used, such as:   Prostaglandin medicine. This medicine causes the cervix to dilate and ripen. The medicine will also start contractions. It can be taken by mouth or by inserting a suppository into the vagina.   Inserting a thin tube (catheter) with a balloon on the end into the vagina to dilate the cervix. Once inserted, the balloon is expanded with water, which causes the cervix to open.   Stripping the membranes. Your health  care provider separates amniotic sac tissue from the cervix, causing the cervix to be stretched and causing the release of a hormone called progesterone. This may cause the uterus to contract. It is often done during an office visit. You will be sent home to wait for the contractions to begin. You will then come in for an induction.   Breaking the water. Your health care provider makes a hole in the amniotic sac using a small instrument. Once the amniotic sac breaks, contractions should begin. This may still take hours to see an effect.   Medicine to trigger or strengthen contractions. This medicine is given through an IV access tube inserted into a vein in your arm.  All of the methods of induction, besides stripping the membranes, will be done in the hospital. Induction is done in the hospital so that you and the baby can be carefully monitored.  HOW LONG DOES IT TAKE FOR LABOR TO BE INDUCED? Some inductions can take up to 2-3 days. Depending on the cervix, it usually takes less time. It takes longer when you are induced early in the pregnancy or if this is your first pregnancy. If a mother is still pregnant and the induction has been going on for 2-3 days, either the mother will be sent home or a cesarean delivery will be needed. WHAT ARE THE RISKS ASSOCIATED WITH LABOR INDUCTION? Some of the risks of induction include:     Changes in fetal heart rate, such as too high, too low, or erratic.   Fetal distress.   Chance of infection for the mother and baby.   Increased chance of having a cesarean delivery.   Breaking off (abruption) of the placenta from the uterus (rare).   Uterine rupture (very rare).  When induction is needed for medical reasons, the benefits of induction may outweigh the risks. WHAT ARE SOME REASONS FOR NOT INDUCING LABOR? Labor induction should not be done if:   It is shown that your baby does not tolerate labor.   You have had previous surgeries on your  uterus, such as a myomectomy or the removal of fibroids.   Your placenta lies very low in the uterus and blocks the opening of the cervix (placenta previa).   Your baby is not in a head-down position.   The umbilical cord drops down into the birth canal in front of the baby. This could cut off the baby's blood and oxygen supply.   You have had a previous cesarean delivery.   There are unusual circumstances, such as the baby being extremely premature.    This information is not intended to replace advice given to you by your health care provider. Make sure you discuss any questions you have with your health care provider.   Document Released: 06/28/2006 Document Revised: 02/27/2014 Document Reviewed: 09/05/2012 Elsevier Interactive Patient Education 2016 Elsevier Inc.  

## 2015-06-13 NOTE — Progress Notes (Signed)
Maureen Lynch MRN: [id number]  Subjective: -Strip reviewed.  In room to assess.  Patient s/p epidural and reports comfort with occasional rectal pressure.  Family at bedside.   Objective: BP 127/78 mmHg  Pulse 68  Temp(Src) 98.6 F (37 C) (Oral)  Resp 18  Ht 5\' 6"  (1.676 m)  Wt 131.543 kg (290 lb)  BMI 46.83 kg/m2  LMP 09/01/2014 (Exact Date)      Fetal Monitoring: FHT: 115 bpm, Mod Var, +Variable Decels, +Accels UC: None graphed, palpates moderate    Vaginal Exam: SVE:   Dilation: 4 Effacement (%): 90 Station: 0 Exam by:: Nonna Renninger, CNM Membranes:AROM Internal Monitors: IUPC and FSE inserted w/o difficulty  Augmentation/Induction: Pitocin:None Cytotec: None  Assessment:  IUP at 40.5wks Cat II FT  Active Labor  Plan: -Discussed IUPC r/b, prior to insertion, including increased risk of infection and ability to adequately monitor quantity and strength of contractions. -Discussed r/b of fetal scalp electrode including infection, trauma to fetal scalp, and ability to monitor fetal heart rate more accurately.   -Patient w/o q/c and agrees to placement of internal monitors -Encouraged to rest -After review of strip, Amnioinfusion ordered at 300/150 -Position change to promote resolution of Cat II FT -Continue other mgmt as ordered  Valma CavaJessica L Sherlon Nied,MSN, CNM 06/13/2015, 11:54 PM

## 2015-06-13 NOTE — Anesthesia Procedure Notes (Signed)
Epidural Patient location during procedure: OB Start time: 06/13/2015 9:55 PM End time: 06/13/2015 10:00 PM  Staffing Anesthesiologist: Ronelle NighEWELL, Nasser Ku Performed by: anesthesiologist   Preanesthetic Checklist Completed: patient identified, site marked, surgical consent, pre-op evaluation, timeout performed, IV checked, risks and benefits discussed and monitors and equipment checked  Epidural Patient position: sitting Prep: site prepped and draped and DuraPrep Patient monitoring: continuous pulse ox and blood pressure Approach: midline Location: L3-L4 Injection technique: LOR air  Needle:  Needle type: Tuohy  Needle gauge: 17 G Needle length: 9 cm and 9 Needle insertion depth: 8 cm Catheter type: closed end flexible Catheter size: 19 Gauge Catheter at skin depth: 13 cm Test dose: negative  Assessment Sensory level: T8 Events: blood not aspirated, injection not painful, no injection resistance, negative IV test and no paresthesia  Additional Notes Patient identified. Risks/Benefits/Options discussed with patient including but not limited to bleeding, infection, nerve damage, paralysis, failed block, incomplete pain control, headache, blood pressure changes, nausea, vomiting, reactions to medication both or allergic, itching and postpartum back pain. Confirmed with bedside nurse the patient's most recent platelet count. Confirmed with patient that they are not currently taking any anticoagulation, have any bleeding history or any family history of bleeding disorders. Patient expressed understanding and wished to proceed. All questions were answered. Sterile technique was used throughout the entire procedure. Please see nursing notes for vital signs. Test dose was given through epidural catheter and negative prior to continuing to dose epidural or start infusion. Warning signs of high block given to the patient including shortness of breath, tingling/numbness in hands, complete motor  block, or any concerning symptoms with instructions to call for help. Patient was given instructions on fall risk and not to get out of bed. All questions and concerns addressed with instructions to call with any issues or inadequate analgesia.

## 2015-06-13 NOTE — Progress Notes (Signed)
Maureen Lynch MRN: [id number]  Subjective: -Care assumTrude Mcburneyed of 30y.o. G1P0 at 40.5wks who presents in early active labor.  In room to assess and discuss POC.  Patient at bedside, on birthing ball, and reports coping well with contractions.  FOB and mother at bedside, supportive.   Objective: BP 98/68 mmHg  Pulse 77  Temp(Src) 98.4 F (36.9 C) (Oral)  Resp 18  Ht 5\' 6"  (1.676 m)  Wt 131.543 kg (290 lb)  BMI 46.83 kg/m2  LMP 09/01/2014 (Exact Date)      Fetal Monitoring: FHT: 160 bpm, Mod Var, +Variable Decels, +Accels UC: palpates moderate    Vaginal Exam: SVE:   Dilation: 4 Effacement (%): 80 Station: -2 Exam by:: Maureen Lynch, cnm Membranes:AROM of scant amt clear fluid at 2058 Internal Monitors: None  Augmentation/Induction: Pitocin:None Cytotec: None  Assessment:  IUP at 40.5wks Cat II FT  Active Labor-Early GBS Negative Amniotomy  Plan: -FHT being monitored above umbilicus.  US, by Dr. Lynford HumphreySR, confirms vertex -Discussed AROM r/b including increased risk of infection, cord prolapse, fetal intolerance, and decreased labor time. No questions or concerns and patient desires to proceed with AROM.  -Discussed pain medication availability including nitrous oxide, IV pain meds, and epidural -Okay for intermittent monitoring if tracing reactive and Cat I 20 minutes s/p AROM -Continue other mgmt as ordered  Maureen CavaJessica L Glenard Keesling,MSN, CNM 06/13/2015, 9:01 PM

## 2015-06-13 NOTE — MAU Note (Signed)
Pt presents stating she thinks she's been having contractions since Friday. Some are worse than others. No pain now. Denies leaking or bleeding. Reports good fetal movement.

## 2015-06-13 NOTE — MAU Note (Signed)
C/o ucs since 2230 on Friday night (2 Days ago); ucs got stronger today @ 0300; no leaking or bleeding; postdates; G!;

## 2015-06-13 NOTE — MAU Note (Signed)
Urine sent to lab 

## 2015-06-14 ENCOUNTER — Encounter (HOSPITAL_COMMUNITY): Payer: Self-pay | Admitting: Emergency Medicine

## 2015-06-14 LAB — RPR: RPR: NONREACTIVE

## 2015-06-14 LAB — ABO/RH: ABO/RH(D): A POS

## 2015-06-14 MED ORDER — DIBUCAINE 1 % RE OINT: 1.0000 "application " | TOPICAL_OINTMENT | RECTAL | Status: DC | PRN

## 2015-06-14 MED ORDER — DIPHENHYDRAMINE HCL 25 MG PO CAPS: 25.0000 mg | ORAL_CAPSULE | Freq: Four times a day (QID) | ORAL | Status: DC | PRN

## 2015-06-14 MED ORDER — SENNOSIDES-DOCUSATE SODIUM 8.6-50 MG PO TABS
2.0000 | ORAL_TABLET | ORAL | Status: DC
  Administered 2015-06-15 (×2): 2 via ORAL
  Filled 2015-06-14 (×2): qty 2

## 2015-06-14 MED ORDER — ONDANSETRON HCL 4 MG PO TABS: 4.0000 mg | ORAL_TABLET | ORAL | Status: DC | PRN

## 2015-06-14 MED ORDER — SIMETHICONE 80 MG PO CHEW: 80.0000 mg | CHEWABLE_TABLET | ORAL | Status: DC | PRN

## 2015-06-14 MED ORDER — IBUPROFEN 600 MG PO TABS
600.0000 mg | ORAL_TABLET | Freq: Four times a day (QID) | ORAL | Status: DC
  Administered 2015-06-14 – 2015-06-16 (×8): 600 mg via ORAL
  Filled 2015-06-14 (×8): qty 1

## 2015-06-14 MED ORDER — BENZOCAINE-MENTHOL 20-0.5 % EX AERO
1.0000 "application " | INHALATION_SPRAY | CUTANEOUS | Status: DC | PRN
  Administered 2015-06-14: 1 via TOPICAL
  Filled 2015-06-14: qty 56

## 2015-06-14 MED ORDER — ZOLPIDEM TARTRATE 5 MG PO TABS: 5.0000 mg | ORAL_TABLET | Freq: Every evening | ORAL | Status: DC | PRN

## 2015-06-14 MED ORDER — PRENATAL MULTIVITAMIN CH
1.0000 | ORAL_TABLET | Freq: Every day | ORAL | Status: DC
  Administered 2015-06-15 – 2015-06-16 (×2): 1 via ORAL
  Filled 2015-06-14 (×2): qty 1

## 2015-06-14 MED ORDER — COCONUT OIL OIL
1.0000 "application " | TOPICAL_OIL | Status: DC | PRN
  Filled 2015-06-14: qty 120

## 2015-06-14 MED ORDER — IBUPROFEN 600 MG PO TABS
600.0000 mg | ORAL_TABLET | Freq: Four times a day (QID) | ORAL | Status: DC
  Administered 2015-06-14: 600 mg via ORAL
  Filled 2015-06-14: qty 1

## 2015-06-14 MED ORDER — ONDANSETRON HCL 4 MG/2ML IJ SOLN: 4.0000 mg | INTRAMUSCULAR | Status: DC | PRN

## 2015-06-14 MED ORDER — ACETAMINOPHEN 325 MG PO TABS
650.0000 mg | ORAL_TABLET | ORAL | Status: DC | PRN
Start: 2015-06-14 — End: 2015-06-16

## 2015-06-14 MED ORDER — WITCH HAZEL-GLYCERIN EX PADS: 1.0000 "application " | MEDICATED_PAD | CUTANEOUS | Status: DC | PRN

## 2015-06-14 MED ORDER — TETANUS-DIPHTH-ACELL PERTUSSIS 5-2.5-18.5 LF-MCG/0.5 IM SUSP: 0.5000 mL | Freq: Once | INTRAMUSCULAR | Status: DC

## 2015-06-14 NOTE — Progress Notes (Signed)
Maureen Lynch MRN: [id number]  Subjective: -Reviewing Strip.  Prolonged decelerations noted.  In room to assess.  Nurse repositioning patient s/p VE.  Patient reports immense rectal pressure.     Objective: BP 125/51 mmHg  Pulse 76  Temp(Src) 98.5 F (36.9 C) (Oral)  Resp 18  Ht 5\' 6"  (1.676 m)  Wt 131.543 kg (290 lb)  BMI 46.83 kg/m2  SpO2 100%  LMP 09/01/2014 (Exact Date)      Fetal Monitoring: FHT: 115 bpm, Marked Var, Prolonged Decels, +Accels UC: Q1-783min, palpates strong    Vaginal Exam: SVE:   Dilation: 8 Effacement (%): 80 Station: 0, +1 Exam by:: katie forsell,rnc Membranes: AROM Internal Monitors: IUPC and FSE in place  Augmentation/Induction: Pitocin:None Cytotec: None  Assessment:  IUP at 40.6wks Cat II FT  Active Labor-Transition  Plan: -Assisted into right lateral -Amnioinfusion discontinued to due minimal fluid return -Anesthesiologist contacted regarding redosing epidural for patient comfort -Continue other mgmt as ordered -Dr. Lynford HumphreySR updated on patient status and advises: *Restart amnioinfusion if necessary *Okay to monitor if patient continues to make progressive change *Call if needed  Maureen RobinsJessica L Vincenzo Stave MSN, CNM 06/14/2015 3:13 AM

## 2015-06-14 NOTE — Lactation Note (Signed)
This note was copied from a baby's chart. Lactation Consultation Note  Patient Name: Maureen Trude Mcburneylayna Horgan [id number]'s Date: 06/14/2015 Reason for consult: Initial assessment Assisted Mom with positioning and latching baby. Basic teaching reviewed with Mom, encouraged to BF with feeding ques. Lactation brochure left for review, advised of OP services and support group. Hand out regarding risks of Marijuana use and breastfeeding left for Mom to review. Did not discuss at visit, company present, but Mom aware of hand out to review. Encouraged to call for assist as needed.   Maternal Data Has patient been taught Hand Expression?: Yes Does the patient have breastfeeding experience prior to this delivery?: No  Feeding Feeding Type: Breast Fed Length of feed: 0 min  LATCH Score/Interventions Latch: Grasps breast easily, tongue down, lips flanged, rhythmical sucking.  Audible Swallowing: A few with stimulation  Type of Nipple: Everted at rest and after stimulation  Comfort (Breast/Nipple): Soft / non-tender     Hold (Positioning): Assistance needed to correctly position infant at breast and maintain latch. Intervention(s): Breastfeeding basics reviewed;Support Pillows;Position options;Skin to skin  LATCH Score: 8  Lactation Tools Discussed/Used WIC Program: Yes   Consult Status Consult Status: Follow-up Date: 06/15/15 Follow-up type: In-patient    Alfred LevinsGranger, [name] 06/14/2015, 1:59 PM

## 2015-06-14 NOTE — Anesthesia Postprocedure Evaluation (Signed)
Anesthesia Post Note  Patient: Maureen Lynch  Procedure(s) Performed: * No procedures listed *  Patient location during evaluation: Mother Baby Anesthesia Type: Epidural Level of consciousness: awake Pain management: satisfactory to patient Vital Signs Assessment: post-procedure vital signs reviewed and stable Respiratory status: spontaneous breathing Cardiovascular status: stable Anesthetic complications: no    Last Vitals:  Filed Vitals:   06/14/15 0601 06/14/15 0631  BP: 129/89 129/60  Pulse: 81 96  Temp:    Resp:      Last Pain:  Filed Vitals:   06/14/15 0710  PainSc: 3                  Maureen Lynch

## 2015-06-14 NOTE — Progress Notes (Signed)
Trude Mcburneylayna Pintor MRN: [id number]  Subjective: -Nurse call requests review of fetal tracing.  Strip reviewed.  In room to assess.  Patient with O2 infusing, left side, peanut in place.    Objective: BP 119/55 mmHg  Pulse 69  Temp(Src) 98.1 F (36.7 C) (Oral)  Resp 18  Ht 5\' 6"  (1.676 m)  Wt 131.543 kg (290 lb)  BMI 46.83 kg/m2  SpO2 100%  LMP 09/01/2014 (Exact Date)      Fetal Monitoring: FHT: 115 bpm, Mod Var, +Variable Decels, +Accels UC: Q1-374min, palpates moderate  Vaginal Exam: SVE:   Dilation: 6 Effacement (%): 90 Station: 0 Exam by:: katie forsell,rnc Membranes:AROM Internal Monitors: IUPC and FSE in place  Augmentation/Induction: Pitocin:None Cytotec: None  Assessment:  IUP at 40.5wks Cat II FT  Active Labor  Plan: -Strip improving with interventions -Will monitor currently -Continue other mgmt as ordered  Valma CavaJessica L Anavi Branscum,MSN, CNM 06/14/2015, 1:09 AM

## 2015-06-15 LAB — CBC
HCT: 34 % — ABNORMAL LOW (ref 36.0–46.0)
Hemoglobin: 11.3 g/dL — ABNORMAL LOW (ref 12.0–15.0)
MCH: 28.6 pg (ref 26.0–34.0)
MCHC: 33.2 g/dL (ref 30.0–36.0)
MCV: 86.1 fL (ref 78.0–100.0)
PLATELETS: 205 10*3/uL (ref 150–400)
RBC: 3.95 MIL/uL (ref 3.87–5.11)
RDW: 15.4 % (ref 11.5–15.5)
WBC: 15.3 10*3/uL — AB (ref 4.0–10.5)

## 2015-06-15 NOTE — Progress Notes (Signed)
Subjective: Postpartum Day 1: Vaginal delivery, right periurethral with extension into periclitoris, left labial and vaginal lacerations Patient up ad lib, reports no syncope or dizziness. Feeding:  Breast  Contraceptive plan:  Considering Mircronor  Objective: Vital signs in last 24 hours: Temp:  [97.9 F (36.6 C)] 97.9 F (36.6 C) (04/25 0524) Pulse Rate:  [65-77] 65 (04/25 0524) Resp:  [18] 18 (04/25 0524) BP: (125-134)/(66-73) 125/73 mmHg (04/25 0524)  Physical Exam:  General: alert and cooperative Lochia: appropriate Uterine Fundus: firm Perineum: healing well DVT Evaluation: No evidence of DVT seen on physical exam.   CBC Latest Ref Rng 06/15/2015 06/13/2015 07/21/2011  WBC 4.0 - 10.5 K/uL 15.3(H) 16.4(H) 9.6  Hemoglobin 12.0 - 15.0 g/dL 11.3(L) 13.2 13.7  Hematocrit 36.0 - 46.0 % 34.0(L) 39.5 43.8  Platelets 150 - 400 K/uL 205 253 -     Assessment/Plan: Status post vaginal delivery day 1. Breastfeeding Stable Continue current care. Plan for discharge tomorrow   Beatrix FettersRachel StallCNM 06/15/2015, 5:26 PM

## 2015-06-15 NOTE — Lactation Note (Signed)
This note was copied from a baby's chart. Lactation Consultation Note  Mother hand expressed drops of colostrum from R side. Mother latched baby in football hold. Sucks and a few swallows observed. Demonstrated how to massage breast to keep baby active. Reviewed waking techniques and cluster feeding. Mother's L nipple is tender.  She has coconut oil and recommended ebm.   Patient Name: Maureen Trude Mcburneylayna Elden RUEAV'WToday's Date: 06/15/2015 Reason for consult: Follow-up assessment   Maternal Data    Feeding Feeding Type: Breast Fed  LATCH Score/Interventions Latch: Repeated attempts needed to sustain latch, nipple held in mouth throughout feeding, stimulation needed to elicit sucking reflex. Intervention(s): Adjust position;Assist with latch;Breast massage  Audible Swallowing: A few with stimulation  Type of Nipple: Everted at rest and after stimulation  Comfort (Breast/Nipple): Filling, red/small blisters or bruises, mild/mod discomfort  Problem noted: Mild/Moderate discomfort Interventions (Mild/moderate discomfort): Hand expression (coconut oil)  Hold (Positioning): No assistance needed to correctly position infant at breast.  LATCH Score: 7  Lactation Tools Discussed/Used Tools: Pump (manual pump w/ instructions on use and cleaning) Breast pump type: Manual   Consult Status Consult Status: Follow-up Date: 06/16/15 Follow-up type: In-patient    Maureen Lynch, [facility] 06/15/2015, 4:10 PM

## 2015-06-16 MED ORDER — PRENATAL MULTIVITAMIN CH: 1.0000 | ORAL_TABLET | Freq: Every day | ORAL | Status: DC

## 2015-06-16 MED ORDER — SENNOSIDES-DOCUSATE SODIUM 8.6-50 MG PO TABS: 2.0000 | ORAL_TABLET | Freq: Every evening | ORAL | Status: DC | PRN

## 2015-06-16 MED ORDER — IBUPROFEN 600 MG PO TABS: 600.0000 mg | ORAL_TABLET | Freq: Four times a day (QID) | ORAL | Status: DC

## 2015-06-16 NOTE — Discharge Summary (Signed)
[facility] Discharge Summary   Patient Name:   Maureen Lynch DOB:     26-Jan-1986 MRN:     [id number]  Date of Admission:   06/13/2015 Date of Discharge:  06/16/2015  Admitting diagnosis:    40 WKS, PAIN, LABOR Active Problems:   Normal labor   Morbid obesity with BMI of 45.0-49.9, adult (HCC)   SVD (spontaneous vaginal delivery)   Periurethral laceration, delivered, current hospitalization   Obstetric vaginal laceration, delivered, current hospitalization   Obstetric labial laceration, delivered, current hospitalization  Term Pregnancy Delivered    Discharge diagnosis:    40 WKS, PAIN, LABOR Active Problems:   Normal labor   Morbid obesity with BMI of 45.0-49.9, adult (HCC)   SVD (spontaneous vaginal delivery)   Periurethral laceration, delivered, current hospitalization   Obstetric vaginal laceration, delivered, current hospitalization   Obstetric labial laceration, delivered, current hospitalization  Term Pregnancy Delivered                                                                     Post partum procedures: none  Type of Delivery:  SVD  Delivering Provider: Gerrit Heck   Date of Delivery:  06/14/15  Newborn Data:    Live born female  Birth Weight: 7 lb 12 oz (3515 g) APGAR: 6, 7  Baby's Name:  Governor Rooks Feeding:   Breast Disposition:   home with mother  Complications:   None    Onset of Labor With Vaginal Delivery     30 y.o. yo G1P1001 at [redacted]w[redacted]d was admitted in Latent Labor on 06/13/2015. Patient had an uncomplicated labor course as follows:  Membrane Rupture Time/Date: 8:58 PM ,06/13/2015   Intrapartum Procedures: Episiotomy: None [1]                                         Lacerations:  Periurethral [8];Labial [10];Vaginal [6]  Patient had a delivery of a Viable infant. 06/14/2015  Information for the patient's newborn:  Contessa, Preuss [[id number]]  Delivery Method: Vaginal, Spontaneous Delivery (Filed from Delivery Summary)    Pateint had an uncomplicated postpartum course.  She is ambulating, tolerating a regular diet, passing flatus, and urinating well. Patient is discharged home in stable condition on 06/16/2015.    Physical Exam:   Filed Vitals:   06/15/15 0524 06/15/15 1700 06/16/15 0529 06/16/15 0626  BP: 125/73 139/63  131/78  Pulse: 65 80  68  Temp: 97.9 F (36.6 C) 97.9 F (36.6 C) 97.7 F (36.5 C)   TempSrc: Oral  Oral   Resp: 18 18  18   Height:      Weight:      SpO2:       General: alert, cooperative and no distress Lochia: appropriate Uterine Fundus: firm Incision: Healing well with no significant drainage DVT Evaluation: No evidence of DVT seen on physical exam.  Labs: Lab Results  Component Value Date   WBC 15.3* 06/15/2015   HGB 11.3* 06/15/2015   HCT 34.0* 06/15/2015   MCV 86.1 06/15/2015   PLT 205 06/15/2015   CMP Latest Ref Rng 07/21/2011  Glucose 70 - 99 mg/dL 80  BUN 6 - 23 mg/dL 14  Creatinine 9.600.50 - 4.541.10 mg/dL 0.980.85  Sodium 119135 - 147145 mEq/L 139  Potassium 3.5 - 5.3 mEq/L 4.1  Chloride 96 - 112 mEq/L 105  CO2 19 - 32 mEq/L 22  Calcium 8.4 - 10.5 mg/dL 11.1(H)  Total Protein 6.0 - 8.3 g/dL 7.4  Total Bilirubin 0.3 - 1.2 mg/dL 0.3  Alkaline Phos 39 - 117 U/L 58  AST 0 - 37 U/L 21  ALT 0 - 35 U/L 18    Discharge instruction: per After Visit Summary and "Baby and Me Booklet".  After Visit Meds:    Medication List    TAKE these medications        ibuprofen 600 MG tablet  Commonly known as:  ADVIL,MOTRIN  Take 1 tablet (600 mg total) by mouth every 6 (six) hours.     prenatal multivitamin Tabs tablet  Take 1 tablet by mouth daily at 12 noon.     senna-docusate 8.6-50 MG tablet  Commonly known as:  Senokot-S  Take 2 tablets by mouth at bedtime as needed for mild constipation.        Diet: routine diet  Activity: Advance as tolerated. Pelvic rest for 6 weeks.   Outpatient follow up:6 weeks Follow up Appt:Future Appointments Date Time Provider  Department Center  06/17/2015 12:00 AM [facility] ROOM [facility] None   Follow up visit: No Follow-up on file.  Postpartum contraception: Progesterone only pills  06/16/2015 Dunia Pringle, CNM Postpartum Care After Vaginal Delivery  After you deliver your newborn (postpartum period), the usual stay in the hospital is 24 72 hours. If there were problems with your labor or delivery, or if you have other medical problems, you might be in the hospital longer.  While you are in the hospital, you will receive help and instructions on how to care for yourself and your newborn during the postpartum period.  While you are in the hospital:  Be sure to tell your nurses if you have pain or discomfort, as well as where you feel the pain and what makes the pain worse.  If you had an incision made near your vagina (episiotomy) or if you had some tearing during delivery, the nurses may put ice packs on your episiotomy or tear. The ice packs may help to reduce the pain and swelling.  If you are breastfeeding, you may feel uncomfortable contractions of your uterus for a couple of weeks. This is normal. The contractions help your uterus get back to normal size.  It is normal to have some bleeding after delivery.  For the first 1 3 days after delivery, the flow is red and the amount may be similar to a period.  It is common for the flow to start and stop.  In the first few days, you may pass some small clots. Let your nurses know if you begin to pass large clots or your flow increases.  Do not  flush blood clots down the toilet before having the nurse look at them.  During the next 3 10 days after delivery, your flow should become more watery and pink or brown-tinged in color.  Ten to fourteen days after delivery, your flow should be a small amount of yellowish-white discharge.  The amount of your flow will decrease over the first few weeks after delivery. Your flow may stop in 6 8 weeks. Most women  have had their flow stop by 12 weeks after delivery.  You should change your sanitary pads  frequently.  Wash your hands thoroughly with soap and water for at least 20 seconds after changing pads, using the toilet, or before holding or feeding your newborn.  You should feel like you need to empty your bladder within the first 6 8 hours after delivery.  In case you become weak, lightheaded, or faint, call your nurse before you get out of bed for the first time and before you take a shower for the first time.  Within the first few days after delivery, your breasts may begin to feel tender and full. This is called engorgement. Breast tenderness usually goes away within 48 72 hours after engorgement occurs. You may also notice milk leaking from your breasts. If you are not breastfeeding, do not stimulate your breasts. Breast stimulation can make your breasts produce more milk.  Spending as much time as possible with your newborn is very important. During this time, you and your newborn can feel close and get to know each other. Having your newborn stay in your room (rooming in) will help to strengthen the bond with your newborn. It will give you time to get to know your newborn and become comfortable caring for your newborn.  Your hormones change after delivery. Sometimes the hormone changes can temporarily cause you to feel sad or tearful. These feelings should not last more than a few days. If these feelings last longer than that, you should talk to your caregiver.  If desired, talk to your caregiver about methods of family planning or contraception.  Talk to your caregiver about immunizations. Your caregiver may want you to have the following immunizations before leaving the hospital:  Tetanus, diphtheria, and pertussis (Tdap) or tetanus and diphtheria (Td) immunization. It is very important that you and your family (including grandparents) or others caring for your newborn are up-to-date with the  Tdap or Td immunizations. The Tdap or Td immunization can help protect your newborn from getting ill.  Rubella immunization.  Varicella (chickenpox) immunization.  Influenza immunization. You should receive this annual immunization if you did not receive the immunization during your pregnancy. Document Released: 12/04/2006 Document Revised: 11/01/2011 Document Reviewed: 10/04/2011 [facility] Patient Information 2014 [place], [place].   Postpartum Depression and Baby Blues  The postpartum period begins right after the birth of a baby. During this time, there is often a great amount of joy and excitement. It is also a time of considerable changes in the life of the parent(s). Regardless of how many times a mother gives birth, each child brings new challenges and dynamics to the family. It is not unusual to have feelings of excitement accompanied by confusing shifts in moods, emotions, and thoughts. All mothers are at risk of developing postpartum depression or the "baby blues." These mood changes can occur right after giving birth, or they may occur many months after giving birth. The baby blues or postpartum depression can be mild or severe. Additionally, postpartum depression can resolve rather quickly, or it can be a long-term condition. CAUSES Elevated hormones and their rapid decline are thought to be a main cause of postpartum depression and the baby blues. There are a number of hormones that radically change during and after pregnancy. Estrogen and progesterone usually decrease immediately after delivering your baby. The level of thyroid hormone and various cortisol steroids also rapidly drop. Other factors that play a major role in these changes include major life events and genetics.  RISK FACTORS If you have any of the following risks for the baby blues  or postpartum depression, know what symptoms to watch out for during the postpartum period. Risk factors that may increase the likelihood of  getting the baby blues or postpartum depression include:  Havinga personal or family history of depression.  Having depression while being pregnant.  Having premenstrual or oral contraceptive-associated mood issues.  Having exceptional life stress.  Having marital conflict.  Lacking a social support network.  Having a baby with special needs.  Having health problems such as diabetes. SYMPTOMS Baby blues symptoms include:  Brief fluctuations in mood, such as going from extreme happiness to sadness.  Decreased concentration.  Difficulty sleeping.  Crying spells, tearfulness.  Irritability.  Anxiety. Postpartum depression symptoms typically begin within the first month after giving birth. These symptoms include:  Difficulty sleeping or excessive sleepiness.  Marked weight loss.  Agitation.  Feelings of worthlessness.  Lack of interest in activity or food. Postpartum psychosis is a very concerning condition and can be dangerous. Fortunately, it is rare. Displaying any of the following symptoms is cause for immediate medical attention. Postpartum psychosis symptoms include:  Hallucinations and delusions.  Bizarre or disorganized behavior.  Confusion or disorientation. DIAGNOSIS  A diagnosis is made by an evaluation of your symptoms. There are no medical or lab tests that lead to a diagnosis, but there are various questionnaires that a caregiver may use to identify those with the baby blues, postpartum depression, or psychosis. Often times, a screening tool called the [place] Postnatal Depression Scale is used to diagnose depression in the postpartum period.  TREATMENT The baby blues usually goes away on its own in 1 to 2 weeks. Social support is often all that is needed. You should be encouraged to get adequate sleep and rest. Occasionally, you may be given medicines to help you sleep.  Postpartum depression requires treatment as it can last several months or longer  if it is not treated. Treatment may include individual or group therapy, medicine, or both to address any social, physiological, and psychological factors that may play a role in the depression. Regular exercise, a healthy diet, rest, and social support may also be strongly recommended.  Postpartum psychosis is more serious and needs treatment right away. Hospitalization is often needed. HOME CARE INSTRUCTIONS  Get as much rest as you can. Nap when the baby sleeps.  Exercise regularly. Some women find yoga and walking to be beneficial.  Eat a balanced and nourishing diet.  Do little things that you enjoy. Have a cup of tea, take a bubble bath, read your favorite magazine, or listen to your favorite music.  Avoid alcohol.  Ask for help with household chores, cooking, grocery shopping, or running errands as needed. Do not try to do everything.  Talk to people close to you about how you are feeling. Get support from your partner, family members, friends, or other new moms.  Try to stay positive in how you think. Think about the things you are grateful for.  Do not spend a lot of time alone.  Only take medicine as directed by your caregiver.  Keep all your postpartum appointments.  Let your caregiver know if you have any concerns. SEEK MEDICAL CARE IF: You are having a reaction or problems with your medicine. SEEK IMMEDIATE MEDICAL CARE IF:  You have suicidal feelings.  You feel you may harm the baby or someone else. Document Released: 11/11/2003 Document Revised: 05/01/2011 Document Reviewed: 12/13/2010 [facility] Patient Information 2014 [place], [place].     Breastfeeding Deciding to breastfeed is one  of the best choices you can make for you and your baby. A change in hormones during pregnancy causes your breast tissue to grow and increases the number and size of your milk ducts. These hormones also allow proteins, sugars, and fats from your blood supply to make breast milk in  your milk-producing glands. Hormones prevent breast milk from being released before your baby is born as well as prompt milk flow after birth. Once breastfeeding has begun, thoughts of your baby, as well as his or her sucking or crying, can stimulate the release of milk from your milk-producing glands.  BENEFITS OF BREASTFEEDING For Your Baby  Your first milk (colostrum) helps your baby's digestive system function better.   There are antibodies in your milk that help your baby fight off infections.   Your baby has a lower incidence of asthma, allergies, and sudden infant death syndrome.   The nutrients in breast milk are better for your baby than infant formulas and are designed uniquely for your baby's needs.   Breast milk improves your baby's brain development.   Your baby is less likely to develop other conditions, such as childhood obesity, asthma, or type 2 diabetes mellitus.  For You   Breastfeeding helps to create a very special bond between you and your baby.   Breastfeeding is convenient. Breast milk is always available at the correct temperature and costs nothing.   Breastfeeding helps to burn calories and helps you lose the weight gained during pregnancy.   Breastfeeding makes your uterus contract to its prepregnancy size faster and slows bleeding (lochia) after you give birth.   Breastfeeding helps to lower your risk of developing type 2 diabetes mellitus, osteoporosis, and breast or ovarian cancer later in life. SIGNS THAT YOUR BABY IS HUNGRY Early Signs of Hunger  Increased alertness or activity.  Stretching.  Movement of the head from side to side.  Movement of the head and opening of the mouth when the corner of the mouth or cheek is stroked (rooting).  Increased sucking sounds, smacking lips, cooing, sighing, or squeaking.  Hand-to-mouth movements.  Increased sucking of fingers or hands. Late Signs of Hunger  Fussing.  Intermittent  crying. Extreme Signs of Hunger Signs of extreme hunger will require calming and consoling before your baby will be able to breastfeed successfully. Do not wait for the following signs of extreme hunger to occur before you initiate breastfeeding:   Restlessness.  A loud, strong cry.   Screaming.   BREASTFEEDING BASICS Breastfeeding Initiation  Find a comfortable place to sit or lie down, with your neck and back well supported.  Place a pillow or rolled up blanket under your baby to bring him or her to the level of your breast (if you are seated). Nursing pillows are specially designed to help support your arms and your baby while you breastfeed.  Make sure that your baby's abdomen is facing your abdomen.   Gently massage your breast. With your fingertips, massage from your chest wall toward your nipple in a circular motion. This encourages milk flow. You may need to continue this action during the feeding if your milk flows slowly.  Support your breast with 4 fingers underneath and your thumb above your nipple. Make sure your fingers are well away from your nipple and your baby's mouth.   Stroke your baby's lips gently with your finger or nipple.   When your baby's mouth is open wide enough, quickly bring your baby to your breast, placing your  entire nipple and as much of the colored area around your nipple (areola) as possible into your baby's mouth.   More areola should be visible above your baby's upper lip than below the lower lip.   Your baby's tongue should be between his or her lower gum and your breast.   Ensure that your baby's mouth is correctly positioned around your nipple (latched). Your baby's lips should create a seal on your breast and be turned out (everted).  It is common for your baby to suck about 2-3 minutes in order to start the flow of breast milk. Latching Teaching your baby how to latch on to your breast properly is very important. An improper latch  can cause nipple pain and decreased milk supply for you and poor weight gain in your baby. Also, if your baby is not latched onto your nipple properly, he or she may swallow some air during feeding. This can make your baby fussy. Burping your baby when you switch breasts during the feeding can help to get rid of the air. However, teaching your baby to latch on properly is still the best way to prevent fussiness from swallowing air while breastfeeding. Signs that your baby has successfully latched on to your nipple:    Silent tugging or silent sucking, without causing you pain.   Swallowing heard between every 3-4 sucks.    Muscle movement above and in front of his or her ears while sucking.  Signs that your baby has not successfully latched on to nipple:   Sucking sounds or smacking sounds from your baby while breastfeeding.  Nipple pain. If you think your baby has not latched on correctly, slip your finger into the corner of your baby's mouth to break the suction and place it between your baby's gums. Attempt breastfeeding initiation again. Signs of Successful Breastfeeding Signs from your baby:   A gradual decrease in the number of sucks or complete cessation of sucking.   Falling asleep.   Relaxation of his or her body.   Retention of a small amount of milk in his or her mouth.   Letting go of your breast by himself or herself. Signs from you:  Breasts that have increased in firmness, weight, and size 1-3 hours after feeding.   Breasts that are softer immediately after breastfeeding.  Increased milk volume, as well as a change in milk consistency and color by the fifth day of breastfeeding.   Nipples that are not sore, cracked, or bleeding. Signs That Your Pecola Leisure is Getting Enough Milk  Wetting at least 3 diapers in a 24-hour period. The urine should be clear and pale yellow by age 22 days.  At least 3 stools in a 24-hour period by age 22 days. The stool should be  soft and yellow.  At least 3 stools in a 24-hour period by age 98 days. The stool should be seedy and yellow.  No loss of weight greater than 10% of birth weight during the first 9 days of age.  Average weight gain of 4-7 ounces (113-198 g) per week after age 27 days.  Consistent daily weight gain by age 22 days, without weight loss after the age of 2 weeks. After a feeding, your baby may spit up a small amount. This is common. BREASTFEEDING FREQUENCY AND DURATION Frequent feeding will help you make more milk and can prevent sore nipples and breast engorgement. Breastfeed when you feel the need to reduce the fullness of your breasts or when your  baby shows signs of hunger. This is called "breastfeeding on demand." Avoid introducing a pacifier to your baby while you are working to establish breastfeeding (the first 4-6 weeks after your baby is born). After this time you may choose to use a pacifier. Research has shown that pacifier use during the first year of a baby's life decreases the risk of sudden infant death syndrome (SIDS). Allow your baby to feed on each breast as long as he or she wants. Breastfeed until your baby is finished feeding. When your baby unlatches or falls asleep while feeding from the first breast, offer the second breast. Because newborns are often sleepy in the first few weeks of life, you may need to awaken your baby to get him or her to feed. Breastfeeding times will vary from baby to baby. However, the following rules can serve as a guide to help you ensure that your baby is properly fed:  Newborns (babies 60 weeks of age or younger) may breastfeed every 1-3 hours.  Newborns should not go longer than 3 hours during the day or 5 hours during the night without breastfeeding.  You should breastfeed your baby a minimum of 8 times in a 24-hour period until you begin to introduce solid foods to your baby at around 49 months of age. BREAST MILK PUMPING Pumping and storing breast  milk allows you to ensure that your baby is exclusively fed your breast milk, even at times when you are unable to breastfeed. This is especially important if you are going back to work while you are still breastfeeding or when you are not able to be present during feedings. Your lactation consultant can give you guidelines on how long it is safe to store breast milk.  A breast pump is a machine that allows you to pump milk from your breast into a sterile bottle. The pumped breast milk can then be stored in a refrigerator or freezer. Some breast pumps are operated by hand, while others use electricity. Ask your lactation consultant which type will work best for you. Breast pumps can be purchased, but some hospitals and breastfeeding support groups lease breast pumps on a monthly basis. A lactation consultant can teach you how to hand express breast milk, if you prefer not to use a pump.  CARING FOR YOUR BREASTS WHILE YOU BREASTFEED Nipples can become dry, cracked, and sore while breastfeeding. The following recommendations can help keep your breasts moisturized and healthy:  Avoid using soap on your nipples.   Wear a supportive bra. Although not required, special nursing bras and tank tops are designed to allow access to your breasts for breastfeeding without taking off your entire bra or top. Avoid wearing underwire-style bras or extremely tight bras.  Air dry your nipples for 3-78minutes after each feeding.   Use only cotton bra pads to absorb leaked breast milk. Leaking of breast milk between feedings is normal.   Use lanolin on your nipples after breastfeeding. Lanolin helps to maintain your skin's normal moisture barrier. If you use pure lanolin, you do not need to wash it off before feeding your baby again. Pure lanolin is not toxic to your baby. You may also hand express a few drops of breast milk and gently massage that milk into your nipples and allow the milk to air dry. In the first few  weeks after giving birth, some women experience extremely full breasts (engorgement). Engorgement can make your breasts feel heavy, warm, and tender to the touch. Engorgement peaks  within 3-5 days after you give birth. The following recommendations can help ease engorgement:  Completely empty your breasts while breastfeeding or pumping. You may want to start by applying warm, moist heat (in the shower or with warm water-soaked hand towels) just before feeding or pumping. This increases circulation and helps the milk flow. If your baby does not completely empty your breasts while breastfeeding, pump any extra milk after he or she is finished.  Wear a snug bra (nursing or regular) or tank top for 1-2 days to signal your body to slightly decrease milk production.  Apply ice packs to your breasts, unless this is too uncomfortable for you.  Make sure that your baby is latched on and positioned properly while breastfeeding. If engorgement persists after 48 hours of following these recommendations, contact your health care provider or a Advertising copywriter. OVERALL HEALTH CARE RECOMMENDATIONS WHILE BREASTFEEDING  Eat healthy foods. Alternate between meals and snacks, eating 3 of each per day. Because what you eat affects your breast milk, some of the foods may make your baby more irritable than usual. Avoid eating these foods if you are sure that they are negatively affecting your baby.  Drink milk, fruit juice, and water to satisfy your thirst (about 10 glasses a day).   Rest often, relax, and continue to take your prenatal vitamins to prevent fatigue, stress, and anemia.  Continue breast self-awareness checks.  Avoid chewing and smoking tobacco.  Avoid alcohol and drug use. Some medicines that may be harmful to your baby can pass through breast milk. It is important to ask your health care provider before taking any medicine, including all over-the-counter and prescription medicine as well as  vitamin and herbal supplements. It is possible to become pregnant while breastfeeding. If birth control is desired, ask your health care provider about options that will be safe for your baby. SEEK MEDICAL CARE IF:   You feel like you want to stop breastfeeding or have become frustrated with breastfeeding.  You have painful breasts or nipples.  Your nipples are cracked or bleeding.  Your breasts are red, tender, or warm.  You have a swollen area on either breast.  You have a fever or chills.  You have nausea or vomiting.  You have drainage other than breast milk from your nipples.  Your breasts do not become full before feedings by the fifth day after you give birth.  You feel sad and depressed.  Your baby is too sleepy to eat well.  Your baby is having trouble sleeping.   Your baby is wetting less than 3 diapers in a 24-hour period.  Your baby has less than 3 stools in a 24-hour period.  Your baby's skin or the white part of his or her eyes becomes yellow.   Your baby is not gaining weight by 52 days of age. SEEK IMMEDIATE MEDICAL CARE IF:   Your baby is overly tired (lethargic) and does not want to wake up and feed.  Your baby develops an unexplained fever. Document Released: 02/06/2005 Document Revised: 02/11/2013 Document Reviewed: 07/31/2012 [facility] Patient Information 2015 [place], [place]. This information is not intended to replace advice given to you by your health care provider. Make sure you discuss any questions you have with your health care provider.

## 2015-06-16 NOTE — Progress Notes (Signed)
CLINICAL SOCIAL WORK MATERNAL/CHILD NOTE  Patient Details  Name: Maureen Lynch MRN: [id number] Date of Birth: 06/14/2014  Date:  06/15/2015  Clinical Social Worker Initiating Note:  Marisue Ivan, MSW intern  Date/ Time Initiated:  06/15/15/1200     Child's Name:  Maureen Lynch    Legal Guardian:  Maureen Lynch (pending paternity Maureen Lynch)    Need for Interpreter:  None   Date of Referral:  06/14/15     Reason for Referral:  Current Substance Use/Substance Use During Pregnancy - Marijuana use    Referral Source:  [facility]   Address:  [address] 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 MOB reported her last use was on September 2016 just a few weeks before finding out she was pregnant.  MOB denied using other substances. Per chart review, the infant's UDS is negative.   Cognitive State:  Insightful , Able to Concentrate , Linear Thinking , Goal Oriented    Mood/Affect:  Happy , Interested , Comfortable , Relaxed , Calm    CSW Assessment:  MSW intern presented in the patient's room due to a consult being placed by the central nursery because of a history of marijuana use. MOB was alone in the room and provided verbal consent for MSW intern to engage. MOB presented to be in a happy mood as evidence by her engaging and smiling during the assessment. Per MOB,  this was her first child. MOB shared her birthing process was "stressful" because she had to change her birth plan. MOB stated she wanted to have a water birth but was unable to due to finances. MOB expressed she also wanted to have an  nonmedicated birth but once her nurse broke her water she could not handle the pain that came with her contractions. MOB reported she ended up getting the epidural and was in labor for 12 hours. According to MOB, she does not have regrets about her overall birthing process and is glad both her and the infant are healthy. MOB expressed breastfeeding going well and denied having any concerns about it. MOB disclosed she was working three jobs up until three weeks ago in order to save up since she did not qualify for [employer]. 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 MOB voiced meeting all of the infant's basic needs and being prepared to go home. Per MOB, she will be staying at her mother's house for the first two weeks in order to get adjusted to her new schedule and receive her mother's help. MOB reported she lives alone in [place] but has a sister nearby that will be helping care for the infant once she returns to work. According to MOB, FOB has been supportive but she will not be adding him to the  birth certificate until paternity is determined.   According to MOB, she was "stressed out" and depressed during the beginning of her pregnancy because she wasn't sure who the father was. MOB stated that she person she initially thought was the father is no longer involved in her life and has not heard from him in months. MOB expressed she constantly "stressed" about the situation and would cry about it at times too. MOB reported her family helped her get through this period of her life along with friends who had been through the same scenario. MOB disclosed that FOB will be taking a paternity  test and once paternity is determined he will be added on to the birth certificate. MSW intern asked MOB how she felt now about everything. MOB shared she has moved on and is no longer "stressed" about it and has left it in "God's hands". MOB said, "it is what it is, my baby has everything he needs and I have a great support system." MSW intern provided education on PMADs. MSW intern also left MOB handouts with additional information and online resources. MOB denied having any further questions or concerns but agreed to contact her OB if needs arise in regard to her mental health postpartum.   MSW intern inquired about MOB's reported marijuana use. MOB shared she had not smoked marijuana since September 2016. MOB expressed she smoked marijuana recreationally prior to the pregnancy but stopped smoking once she found out she was pregnant. MOB denied using any other substances prior to or during the pregnancy. MSW intern informed MOB about the hospital's drug screening policy and procedures. MSW intern also let MOB know the infant's UDS was negative. MOB denied having any further questions or concerns. MOB was agreeable with the hospital's policy and procedures.  MOB shared her sister just had her baby a week ago as well. Per MOB, her mother is "excited" about her new grandchild along with her sister and niece. MOB restated she has a great support system.    MOB thanked MSW intern for the education and information provided and agreed to contact her if needs arise.   CSW Plan/Description:   Restaurant manager, fast foodatient/Family Education- MSW intern provided education on PMADs and the hospital's support group, Feelings After Birth.  MSW intern to monitor cord tissue and file a Child Protective Service Report if needs arise. Infant's UDS is negative.  No Further Intervention Required/No Barriers to Discharge    Kandis CockingYazmin Aowyn Rozeboom, Student-SW 06/15/2015, 12:57 PM

## 2015-06-16 NOTE — Lactation Note (Signed)
This note was copied from a baby's chart. Lactation Consultation Note  Baby latched in football hold upon entering. Rhythmical sucks and swallows observed.  After 30 min baby fell asleep.  Suggest mother burp baby and breastfeed on other breast. Mother states she has only been breastfeeding on one breast and has been giving a pacifier. Pacifier use not recommended at this time.  Explained this will increase volume baby is getting. Mother hand expressed good flow of colostrum. Reviewed hand pump use. Suggest mother do some post pumping to increase her volume when she gets home. Recommend post pumping 10-15 min a few times a day and give baby back volume pumped. She can use curved tip syringe today or slow flow nipple starting tomorrow if that is her preference. Reviewed engorgement care and monitoring voids/stools. Mom encouraged to feed baby 8-12 times/24 hours and with feeding cues.  Suggest she call if she has questions regarding breastfeeding.  Patient Name: Boy Trude Mcburneylayna Abrams ZHYQM'VToday's Date: 06/16/2015 Reason for consult: Follow-up assessment   Maternal Data    Feeding Feeding Type: Breast Fed Length of feed: 30 min  LATCH Score/Interventions Latch: Grasps breast easily, tongue down, lips flanged, rhythmical sucking.  Audible Swallowing: Spontaneous and intermittent  Type of Nipple: Everted at rest and after stimulation  Comfort (Breast/Nipple): Filling, red/small blisters or bruises, mild/mod discomfort  Problem noted: Mild/Moderate discomfort Interventions (Mild/moderate discomfort): Hand expression;Hand massage  Hold (Positioning): No assistance needed to correctly position infant at breast.  LATCH Score: 9  Lactation Tools Discussed/Used     Consult Status Consult Status: Complete    Hardie PulleyBerkelhammer, Joesph Marcy Boschen 06/16/2015, 11:41 AM

## 2015-06-17 ENCOUNTER — Inpatient Hospital Stay (HOSPITAL_COMMUNITY): Admission: RE | Admit: 2015-06-17 | Payer: Medicaid Other | Source: Ambulatory Visit

## 2018-08-15 ENCOUNTER — Ambulatory Visit (HOSPITAL_COMMUNITY)
Admission: EM | Admit: 2018-08-15 | Discharge: 2018-08-15 | Disposition: A | Discharge status: Home / Self Care | Payer: No Typology Code available for payment source | Attending: Family Medicine | Admitting: Family Medicine

## 2018-08-15 ENCOUNTER — Other Ambulatory Visit: Payer: Self-pay

## 2018-08-15 ENCOUNTER — Encounter (HOSPITAL_COMMUNITY): Payer: Self-pay

## 2018-08-15 DIAGNOSIS — L500 Allergic urticaria: Secondary | ICD-10-CM | POA: Diagnosis not present

## 2018-08-15 MED ORDER — PREDNISONE 10 MG (21) PO TBPK: ORAL_TABLET | ORAL | 0 refills | Status: DC

## 2018-08-15 MED ORDER — METHYLPREDNISOLONE SODIUM SUCC 125 MG IJ SOLR
80.0000 mg | Freq: Once | INTRAMUSCULAR | Status: AC
  Administered 2018-08-15: 80 mg via INTRAMUSCULAR

## 2018-08-15 MED ORDER — METHYLPREDNISOLONE SODIUM SUCC 125 MG IJ SOLR
INTRAMUSCULAR | Status: AC
  Filled 2018-08-15: qty 2

## 2018-08-15 NOTE — ED Triage Notes (Signed)
Patient presents to Urgent Care with complaints of itchy bumps sporatically all over her body since 3 days ago. Patient reports she does not know what has caused it, son who lives with her does not have them.

## 2018-08-15 NOTE — ED Provider Notes (Signed)
[facility]    CSN: [id number] Arrival date & time: 08/15/18  1111     History   Chief Complaint Chief Complaint  Patient presents with  . Pruritis    HPI Maureen Lynch is a 33 y.o. female.   Patient is a 33 year old female presents today with possible allergic reaction.  Reporting this started 3 days ago and is worsening.  The rash is widespread.  The rash is very pruritic.  She has been doing hydrocortisone cream and Benadryl with mild relief.    Denies any fever, joint pain. Denies any recent changes in lotions, detergents, foods or other possible irritants. No recent travel. Nobody else at home has the rash. Patient has been outside but denies any contact with plants or insects. No new foods or medications.   ROS per HPI        Past Medical History:  Diagnosis Date  . Headache   . History of marijuana use    last use- 10/2014  . History of trichomonal vaginitis    treated  . Hx of chlamydia infection   . Vaginal Pap smear, abnormal     Patient Active Problem List   Diagnosis Date Noted  . SVD (spontaneous vaginal delivery) 06/14/2015  . Periurethral laceration, delivered, current hospitalization 06/14/2015  . Obstetric vaginal laceration, delivered, current hospitalization 06/14/2015  . Obstetric labial laceration, delivered, current hospitalization 06/14/2015  . Normal labor 06/13/2015  . Morbid obesity with BMI of 45.0-49.9, adult ([facility]) 06/13/2015    Past Surgical History:  Procedure Laterality Date  . KNEE ARTHROCENTESIS    . THERAPEUTIC ABORTION    . TONSILLECTOMY AND ADENOIDECTOMY      OB History    Gravida  1   Para  1   Term  1   Preterm      AB      Living  1     SAB      TAB      Ectopic      Multiple  0   Live Births  1            Home Medications    Prior to Admission medications   Medication Sig Start Date End Date Taking? Authorizing Provider  ibuprofen (ADVIL,MOTRIN) 600 MG tablet Take 1 tablet  (600 mg total) by mouth every 6 (six) hours. 06/16/15   Standard, Venus, CNM  predniSONE (STERAPRED UNI-PAK 21 TAB) 10 MG (21) TBPK tablet 6 tabs for 1 day, then 5 tabs for 1 das, then 4 tabs for 1 day, then 3 tabs for 1 day, 2 tabs for 1 day, then 1 tab for 1 day 08/15/18   Loura Halt A, NP  Prenatal Vit-Fe Fumarate-FA (PRENATAL MULTIVITAMIN) TABS tablet Take 1 tablet by mouth daily at 12 noon. 06/16/15   Standard, Venus, CNM  senna-docusate (SENOKOT-S) 8.6-50 MG tablet Take 2 tablets by mouth at bedtime as needed for mild constipation. 06/16/15   Standard, Venus, CNM    Family History Family History  Problem Relation Age of Onset  . Diabetes Mother   . Healthy Father   . Hypertension Brother     Social History Social History   Tobacco Use  . Smoking status: Never Smoker  . Smokeless tobacco: Never Used  Substance Use Topics  . Alcohol use: Yes    Comment: last use Aug 2016  . Drug use: Yes    Types: Marijuana    Comment: last use Sept 2016     Allergies  Patient has no known allergies.   Review of Systems Review of Systems   Physical Exam Triage Vital Signs ED Triage Vitals  Enc Vitals Group     BP 08/15/18 1135 123/80     Pulse Rate 08/15/18 1135 80     Resp 08/15/18 1135 17     Temp 08/15/18 1135 98 F (36.7 C)     Temp Source 08/15/18 1135 Oral     SpO2 08/15/18 1135 100 %     Weight --      Height --      Head Circumference --      Peak Flow --      Pain Score 08/15/18 1133 0     Pain Loc --      Pain Edu? --      Excl. in GC? --    No data found.  Updated Vital Signs BP 123/80 (BP Location: Left Arm)   Pulse 80   Temp 98 F (36.7 C) (Oral)   Resp 17   LMP 08/04/2018 (Exact Date)   SpO2 100%   Visual Acuity Right Eye Distance:   Left Eye Distance:   Bilateral Distance:    Right Eye Near:   Left Eye Near:    Bilateral Near:     Physical Exam Vitals signs and nursing note reviewed.  Constitutional:      General: She is not in acute  distress.    Appearance: Normal appearance. She is not ill-appearing, toxic-appearing or diaphoretic.  HENT:     Head: Normocephalic and atraumatic.     Nose: Nose normal.     Mouth/Throat:     Pharynx: Oropharynx is clear.  Eyes:     Conjunctiva/sclera: Conjunctivae normal.  Neck:     Musculoskeletal: Normal range of motion.  Pulmonary:     Effort: Pulmonary effort is normal.  Musculoskeletal: Normal range of motion.  Skin:    General: Skin is warm and dry.     Findings: Rash present.     Comments: Widespread urticaria  Neurological:     Mental Status: She is alert.  Psychiatric:        Mood and Affect: Mood normal.      UC Treatments / Results  Labs (all labs ordered are listed, but only abnormal results are displayed) Labs Reviewed - No data to display  EKG None  Radiology No results found.  Procedures Procedures (including critical care time)  Medications Ordered in UC Medications  methylPREDNISolone sodium succinate (SOLU-MEDROL) 125 mg/2 mL injection 80 mg (has no administration in time range)    Initial Impression / Assessment and Plan / UC Course  I have reviewed the triage vital signs and the nursing notes.  Pertinent labs & imaging results that were available during my care of the patient were reviewed by me and considered in my medical decision making (see chart for details).     Patient with some sort of allergic reaction.  Patient mentioned being outside and been around some insecticides.  Similar incidents a year ago but not as bad. She has been taking Benadryl and hydrocortisone cream without much relief. We will do steroid injection here in clinic and sent home with prednisone taper She can keep using Benadryl or Zyrtec for itching Follow up as needed for continued or worsening symptoms  Final Clinical Impressions(s) / UC Diagnoses   Final diagnoses:  Allergic urticaria     Discharge Instructions     This is some sort of allergic  reaction. Treating you with steroid injection here in clinic. Sending you home with steroid taper.  Start this tomorrow.  Make sure you take the prednisone with food You can continue with the Benadryl for itching.  For nondrowsy can take Zyrtec Follow up as needed for continued or worsening symptoms     ED Prescriptions    Medication Sig Dispense Auth. Provider   predniSONE (STERAPRED UNI-PAK 21 TAB) 10 MG (21) TBPK tablet 6 tabs for 1 day, then 5 tabs for 1 das, then 4 tabs for 1 day, then 3 tabs for 1 day, 2 tabs for 1 day, then 1 tab for 1 day 21 tablet Jaci LazierBast, Future Yeldell A, NP     Controlled Substance Prescriptions [place] Controlled Substance Registry consulted? Not Applicable   Janace ArisBast, Berle Fitz A, NP 08/15/18 1158

## 2018-08-15 NOTE — Discharge Instructions (Addendum)
This is some sort of allergic reaction. Treating you with steroid injection here in clinic. Sending you home with steroid taper.  Start this tomorrow.  Make sure you take the prednisone with food You can continue with the Benadryl for itching.  For nondrowsy can take Zyrtec Follow up as needed for continued or worsening symptoms

## 2020-06-16 ENCOUNTER — Emergency Department (HOSPITAL_COMMUNITY): Payer: 59

## 2020-06-16 ENCOUNTER — Encounter (HOSPITAL_COMMUNITY): Payer: Self-pay | Admitting: Emergency Medicine

## 2020-06-16 ENCOUNTER — Emergency Department (HOSPITAL_COMMUNITY)
Admission: EM | Admit: 2020-06-16 | Discharge: 2020-06-17 | Disposition: A | Discharge status: Home / Self Care | Payer: 59 | Attending: Emergency Medicine | Admitting: Emergency Medicine

## 2020-06-16 ENCOUNTER — Other Ambulatory Visit: Payer: Self-pay

## 2020-06-16 DIAGNOSIS — Y9368 Activity, volleyball (beach) (court): Secondary | ICD-10-CM | POA: Insufficient documentation

## 2020-06-16 DIAGNOSIS — X501XXA Overexertion from prolonged static or awkward postures, initial encounter: Secondary | ICD-10-CM | POA: Insufficient documentation

## 2020-06-16 DIAGNOSIS — M25562 Pain in left knee: Secondary | ICD-10-CM | POA: Insufficient documentation

## 2020-06-16 NOTE — ED Provider Notes (Addendum)
Emergency Medicine Provider Triage Evaluation Note  Maureen Lynch 35 y.o. F was evaluated in triage.  Pt complains of left knee pain that began today.  Patient reports she was at volleyball practice and states that when she went to serve all, she stepped and folic her knee twisted.  Did not fall the ground.  Since then has had pain.  She has been able to ambulate and bear weight but does report pain with doing so.  No numbness/weakness.  Review of Systems  Positive: Knee pain Negative: Numbness/weakness  Physical Exam  BP 134/82   Pulse 70   Temp 98.2 F (36.8 C) (Oral)   Resp 18   Ht 5\' 4"  (1.626 m)   Wt 65.8 kg   SpO2 100%   BMI 24.89 kg/m  Gen:   Awake, no distress   HEENT:  Atraumatic  MSK:   Moves extremities without difficulty.  Tenderness palpation in anterior and posterior aspect of the left knee.  No overlying warmth, erythema.  Flexion/extension intact but pain with flexion. Neuro:  Speech clear   Medical Decision Making  Medically screening exam initiated at 3:55 AM.  Appropriate orders placed. Maureen Lynch was informed that the remainder of the evaluation will be completed by another provider, this initial triage assessment does not replace that evaluation, and the importance of remaining in the ED until their evaluation is complete.  Clinical Impression  Knee pain   Portions of this note were generated with Dragon dictation software. Dictation errors may occur despite best attempts at proofreading.     Maureen Mcburney, PA-C 06/16/20 2300    06/18/20, PA-C 06/17/20 0009    06/19/20, PA-C 06/17/20 06/19/20    Rich Fuchs, MD 06/18/20 1526

## 2020-06-16 NOTE — ED Triage Notes (Signed)
Today at volleyball practice, she was trying to serve and turned her left knee.  Pt has HX of surgery on this knee.

## 2020-06-17 ENCOUNTER — Encounter (HOSPITAL_COMMUNITY): Payer: Self-pay | Admitting: Student

## 2020-06-17 MED ORDER — NAPROXEN 250 MG PO TABS
500.0000 mg | ORAL_TABLET | Freq: Once | ORAL | Status: AC
  Administered 2020-06-17: 500 mg via ORAL
  Filled 2020-06-17: qty 2

## 2020-06-17 MED ORDER — NAPROXEN 500 MG PO TABS: 500.0000 mg | ORAL_TABLET | Freq: Two times a day (BID) | ORAL | 0 refills | Status: DC | PRN

## 2020-06-17 NOTE — Discharge Instructions (Addendum)
Please read and follow all provided instructions.  You have been seen today for an injury to your left knee.   Tests performed today include: An x-ray of the affected area - does NOT show any broken bones or dislocations.  Vital signs. See below for your results today.   Home care instructions: -- *PRICE in the first 24-48 hours after injury: Protect (with brace, splint, sling), if given by your provider Rest Ice- Do not apply ice pack directly to your skin, place towel or similar between your skin and ice/ice pack. Apply ice for 20 min, then remove for 40 min while awake Compression- Wear brace, elastic bandage, splint as directed by your provider Elevate affected extremity above the level of your heart when not walking around for the first 24-48 hours   Medications:  - Naproxen is a nonsteroidal anti-inflammatory medication that will help with pain and swelling. Be sure to take this medication as prescribed with food, 1 pill every 12 hours,  It should be taken with food, as it can cause stomach upset, and more seriously, stomach bleeding. Do not take other nonsteroidal anti-inflammatory medications with this such as Advil, Motrin, Aleve, Mobic, Goodie Powder, or Motrin.    You make take Tylenol per over the counter dosing with these medications.   We have prescribed you new medication(s) today. Discuss the medications prescribed today with your pharmacist as they can have adverse effects and interactions with your other medicines including over the counter and prescribed medications. Seek medical evaluation if you start to experience new or abnormal symptoms after taking one of these medicines, seek care immediately if you start to experience difficulty breathing, feeling of your throat closing, facial swelling, or rash as these could be indications of a more serious allergic reaction   Follow-up instructions: Please follow-up with your primary care provider or the provided orthopedic  physician (bone specialist) if you continue to have significant pain in 1 week. In this case you may have a more severe injury that requires further care.   Return instructions:  Please return if your digits or extremity are numb or tingling, appear gray or blue, or you have severe pain (also elevate the extremity and loosen splint or wrap if you were given one) Please return if you have redness or fevers.  Please return to the Emergency Department if you experience worsening symptoms.  Please return if you have any other emergent concerns. Additional Information:  Your vital signs today were: BP (!) 145/86 (BP Location: Right Arm)   Pulse 84   Temp 98.4 F (36.9 C) (Oral)   Resp 14   Ht 5\' 6"  (1.676 m)   Wt 131.5 kg   SpO2 99%   BMI 46.79 kg/m  If your blood pressure (BP) was elevated above 135/85 this visit, please have this repeated by your doctor within one month. ---------------

## 2020-06-17 NOTE — ED Provider Notes (Signed)
[facility] EMERGENCY DEPARTMENT Provider Note   CSN: [id number] Arrival date & time: 06/16/20  2244     History Chief Complaint  Patient presents with  . Knee Pain    Maureen Lynch is a 35 y.o. female who presents to the ED with complaints of left knee pain that began earlier today. 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 No fall to the ground. Since this occurred she has had pain to the knee diffusely, worse with certain movements/positions/ambulation. Has ambulated since but has discomfort while doing so. She denies any other areas of injury. Has had surgery on the left knee previously. Denies numbness, tingling, weakness, or open wounds. Denies chance of pregnancy.   HPI     Past Medical History:  Diagnosis Date  . Headache   . History of marijuana use    last use- 10/2014  . History of trichomonal vaginitis    treated  . Hx of chlamydia infection   . Vaginal Pap smear, abnormal     Patient Active Problem List   Diagnosis Date Noted  . SVD (spontaneous vaginal delivery) 06/14/2015  . Periurethral laceration, delivered, current hospitalization 06/14/2015  . Obstetric vaginal laceration, delivered, current hospitalization 06/14/2015  . Obstetric labial laceration, delivered, current hospitalization 06/14/2015  . Normal labor 06/13/2015  . Morbid obesity with BMI of 45.0-49.9, adult (HCC) 06/13/2015    Past Surgical History:  Procedure Laterality Date  . KNEE ARTHROCENTESIS    . THERAPEUTIC ABORTION    . TONSILLECTOMY AND ADENOIDECTOMY       OB History    Gravida  1   Para  1   Term  1   Preterm      AB      Living  1     SAB      IAB      Ectopic      Multiple  0   Live Births  1           Family History  Problem Relation Age of Onset  . Diabetes Mother   . Healthy Father   .  Hypertension Brother     Social History   Tobacco Use  . Smoking status: Never Smoker  . Smokeless tobacco: Never Used  Vaping Use  . Vaping Use: Never used  Substance Use Topics  . Alcohol use: Yes    Comment: last use Aug 2016  . Drug use: Yes    Types: Marijuana    Comment: last use Sept 2016    Home Medications Prior to Admission medications   Medication Sig Start Date End Date Taking? Authorizing Provider  naproxen (NAPROSYN) 500 MG tablet Take 1 tablet (500 mg total) by mouth 2 (two) times daily as needed for moderate pain. 06/17/20  Yes Dasean Brow R, PA-C  predniSONE (STERAPRED UNI-PAK 21 TAB) 10 MG (21) TBPK tablet 6 tabs for 1 day, then 5 tabs for 1 das, then 4 tabs for 1 day, then 3 tabs for 1 day, 2 tabs for 1 day, then 1 tab for 1 day 08/15/18   Dahlia Byes A, NP  Prenatal Vit-Fe Fumarate-FA (PRENATAL MULTIVITAMIN) TABS tablet Take 1 tablet by mouth daily at 12 noon. 06/16/15   Standard, Venus, CNM  senna-docusate (SENOKOT-S) 8.6-50 MG tablet Take 2 tablets by mouth at bedtime as needed for mild constipation. 06/16/15  Standard, Venus, CNM    Allergies    Patient has no known allergies.  Review of Systems   Review of Systems  Constitutional: Negative for chills and fever.  Respiratory: Negative for shortness of breath.   Cardiovascular: Negative for chest pain.  Musculoskeletal: Positive for arthralgias.  Skin: Negative for wound.  Neurological: Negative for weakness and numbness.    Physical Exam Updated Vital Signs BP (!) 145/86 (BP Location: Right Arm)   Pulse 84   Temp 98.4 F (36.9 C) (Oral)   Resp 14   Ht 5\' 6"  (1.676 m)   Wt 131.5 kg   SpO2 99%   BMI 46.79 kg/m   Physical Exam Vitals and nursing note reviewed.  Constitutional:      General: She is not in acute distress.    Appearance: She is not ill-appearing or toxic-appearing.  HENT:     Head: Normocephalic and atraumatic.  Cardiovascular:     Pulses:          Dorsalis pedis  pulses are 2+ on the right side and 2+ on the left side.       Posterior tibial pulses are 2+ on the right side and 2+ on the left side.  Pulmonary:     Effort: Pulmonary effort is normal.  Musculoskeletal:     Comments: Lower extremities: No obvious deformity, appreciable effusion, open wounds, or ecchymosis.  Patient is able to fully extend at the left knee, she is able to flex to just past 90 degrees, this is uncomfortable to do so.  She has full active range of motion of the ankle is able to actively move the hip of the left lower extremity.  Right lower extremity active range of motion intact as well.  Patient tender to palpation to the diffuse left knee anteriorly including the patella, tibial tuberosity, as well as to the medial and lateral joint lines.  No obvious laxity appreciated.  She does have some pain with valgus stress test.   Skin:    General: Skin is warm and dry.     Capillary Refill: Capillary refill takes less than 2 seconds.  Neurological:     Mental Status: She is alert.     Comments: Alert. Clear speech. Sensation grossly intact to bilateral lower extremities.  Patient able to flex/extend bilateral knees against resistance.  5/5 strength with plantar/dorsiflexion bilaterally.  Psychiatric:        Mood and Affect: Mood normal.        Behavior: Behavior normal.     ED Results / Procedures / Treatments   Labs (all labs ordered are listed, but only abnormal results are displayed) Labs Reviewed - No data to display  EKG None  Radiology DG Knee Complete 4 Views Left  Result Date: 06/16/2020 CLINICAL DATA:  35 year old female with left knee pain. EXAM: LEFT KNEE - COMPLETE 4+ VIEW COMPARISON:  None. FINDINGS: There is no acute fracture or dislocation. There is mild arthritic changes with meniscal calcinosis and spurring. Postsurgical changes of ACL repair. The bones are well mineralized. No joint effusion. The soft tissues are unremarkable. IMPRESSION: 1. No acute  fracture or dislocation. 2. Mild arthritic changes. Electronically Signed   By: 20 M.D.   On: 06/16/2020 23:38    Procedures Procedures   Medications Ordered in ED Medications  naproxen (NAPROSYN) tablet 500 mg (500 mg Oral Given 06/17/20 0044)    ED Course  I have reviewed the triage vital signs and the nursing notes.  Pertinent labs & imaging results that were available during my care of the patient were reviewed by me and considered in my medical decision making (see chart for details).    MDM Rules/Calculators/A&P                         Patient presents to the ED with complaints of left knee pain after twisting type injury when stepping forward earlier today. Nontoxic, BP mildly elevated- doubt HTN emergency.   Additional history obtained:  Additional history obtained from chart review & nursing note review.   Imaging Studies ordered:  Left knee x-ray ordered by triage team, I independently reviewed, formal radiology impression shows:  1. No acute fracture or dislocation. 2. Mild arthritic changes.  ED Course:  No signs of infection on exam.  X-ray negative for fracture or dislocation.  Patient neurovascularly intact distally.  No significant joint instability on exam.  Question possibility of ligamentous injury.  Patient will be placed in knee immobilizer, provided with crutches, and given a prescription for naproxen with orthopedics follow-up. PRICE. I discussed results, treatment plan, need for follow-up, and return precautions with the patient. Provided opportunity for questions, patient confirmed understanding and is in agreement with plan.    Portions of this note were generated with Scientist, clinical (histocompatibility and immunogenetics). Dictation errors may occur despite best attempts at proofreading.  Final Clinical Impression(s) / ED Diagnoses Final diagnoses:  Acute pain of left knee    Rx / DC Orders ED Discharge Orders         Ordered    naproxen (NAPROSYN) 500 MG tablet  2  times daily PRN        06/17/20 0034           Cherly Anderson, PA-C 06/17/20 0056    Shon Baton, MD 06/17/20 [phone]

## 2022-05-08 ENCOUNTER — Other Ambulatory Visit: Payer: Self-pay | Admitting: Family Medicine

## 2022-05-08 DIAGNOSIS — N6320 Unspecified lump in the left breast, unspecified quadrant: Secondary | ICD-10-CM

## 2022-05-13 ENCOUNTER — Other Ambulatory Visit: Payer: Medicaid Other

## 2022-05-24 ENCOUNTER — Ambulatory Visit
Admission: RE | Admit: 2022-05-24 | Discharge: 2022-05-24 | Disposition: A | Discharge status: Home / Self Care | Payer: BC Managed Care – PPO | Source: Ambulatory Visit | Attending: Family Medicine | Admitting: Family Medicine

## 2022-05-24 DIAGNOSIS — N6320 Unspecified lump in the left breast, unspecified quadrant: Secondary | ICD-10-CM

## 2022-06-05 ENCOUNTER — Ambulatory Visit (INDEPENDENT_AMBULATORY_CARE_PROVIDER_SITE_OTHER): Payer: BC Managed Care – PPO | Admitting: Primary Care

## 2022-06-05 ENCOUNTER — Encounter: Payer: Self-pay | Admitting: Primary Care

## 2022-06-05 VITALS — BP 118/78 | HR 73 | Ht 65.0 in | Wt 289.0 lb

## 2022-06-05 DIAGNOSIS — R0683 Snoring: Secondary | ICD-10-CM | POA: Diagnosis not present

## 2022-06-05 NOTE — Patient Instructions (Signed)
Sleep apnea is defined as period of 10 seconds or longer when you stop breathing at night. This can happen multiple times a night. Dx sleep apnea is when this occurs more than 5 times an hour.    Mild OSA 5-15 apneic events an hour Moderate OSA 15-30 apneic events an hour Severe OSA > 30 apneic events an hour   Untreated sleep apnea puts you at higher risk for cardiac arrhythmias, pulmonary HTN, stroke and diabetes  Treatment options include weight loss, side sleeping position, oral appliance, CPAP therapy or referral to ENT for possible surgical options    Recommendations: Focus on side sleeping position or elevate head with wedge pillow 30 degrees Work on weight loss efforts if able  Do not drive if experiencing excessive daytime sleepiness of fatigue    Orders: Home sleep study re: loud snoring    Follow-up: Please call to schedule follow-up 1-2 weeks after completing home sleep study to review results and treatment if needed (can be virtual)  Sleep Apnea Sleep apnea affects breathing during sleep. It causes breathing to stop for 10 seconds or more, or to become shallow. People with sleep apnea usually snore loudly. It can also increase the risk of: Heart attack. Stroke. Being very overweight (obese). Diabetes. Heart failure. Irregular heartbeat. High blood pressure. The goal of treatment is to help you breathe normally again. What are the causes?  The most common cause of this condition is a collapsed or blocked airway. There are three kinds of sleep apnea: Obstructive sleep apnea. This is caused by a blocked or collapsed airway. Central sleep apnea. This happens when the brain does not send the right signals to the muscles that control breathing. Mixed sleep apnea. This is a combination of obstructive and central sleep apnea. What increases the risk? Being overweight. Smoking. Having a small airway. Being older. Being female. Drinking alcohol. Taking medicines to  calm yourself (sedatives or tranquilizers). Having family members with the condition. Having a tongue or tonsils that are larger than normal. What are the signs or symptoms? Trouble staying asleep. Loud snoring. Headaches in the morning. Waking up gasping. Dry mouth or sore throat in the morning. Being sleepy or tired during the day. If you are sleepy or tired during the day, you may also: Not be able to focus your mind (concentrate). Forget things. Get angry a lot and have mood swings. Feel sad (depressed). Have changes in your personality. Have less interest in sex, if you are female. Be unable to have an erection, if you are female. How is this treated?  Sleeping on your side. Using a medicine to get rid of mucus in your nose (decongestant). Avoiding the use of alcohol, medicines to help you relax, or certain pain medicines (narcotics). Losing weight, if needed. Changing your diet. Quitting smoking. Using a machine to open your airway while you sleep, such as: An oral appliance. This is a mouthpiece that shifts your lower jaw forward. A CPAP device. This device blows air through a mask when you breathe out (exhale). An EPAP device. This has valves that you put in each nostril. A BIPAP device. This device blows air through a mask when you breathe in (inhale) and breathe out. Having surgery if other treatments do not work. Follow these instructions at home: Lifestyle Make changes that your doctor recommends. Eat a healthy diet. Lose weight if needed. Avoid alcohol, medicines to help you relax, and some pain medicines. Do not smoke or use any products that contain   nicotine or tobacco. If you need help quitting, ask your doctor. General instructions Take over-the-counter and prescription medicines only as told by your doctor. If you were given a machine to use while you sleep, use it only as told by your doctor. If you are having surgery, make sure to tell your doctor you have  sleep apnea. You may need to bring your device with you. Keep all follow-up visits. Contact a doctor if: The machine that you were given to use during sleep bothers you or does not seem to be working. You do not get better. You get worse. Get help right away if: Your chest hurts. You have trouble breathing in enough air. You have an uncomfortable feeling in your back, arms, or stomach. You have trouble talking. One side of your body feels weak. A part of your face is hanging down. These symptoms may be an emergency. Get help right away. Call your local emergency services (911 in the [place] Do not wait to see if the symptoms will go away. Do not drive yourself to the hospital. Summary This condition affects breathing during sleep. The most common cause is a collapsed or blocked airway. The goal of treatment is to help you breathe normally while you sleep. This information is not intended to replace advice given to you by your health care provider. Make sure you discuss any questions you have with your health care provider. Document Revised: 09/15/2020 Document Reviewed: 01/16/2020 Elsevier Patient Education  2023 [employer]   

## 2022-06-05 NOTE — Assessment & Plan Note (Signed)
-   Patient has symptoms of snoring and restless sleep. She feels she sleeps fairly well, no reports of significant daytime sleepiness. Epworth score 12.  BMI 48.  Concern patient has underlying obstructive sleep apnea, needs home sleep study evaluate.  We reviewed risks of untreated sleep apnea including cardiac arrhythmias, pulmonary hypertension, diabetes and stroke.  We also discussed treatment options including weight loss, oral appliance, CPAP therapy or referral to ENT for possible surgical options.  Encourage patient focus on side sleeping position.  Advised against driving if experiencing excessive daytime sleepiness or fatigue.  Follow-up 1 to 2 weeks after sleep study to review results and treatment options if needed.

## 2022-06-05 NOTE — Progress Notes (Signed)
  ID: Maureen Lynch, female    DOB: August 18, 1985, 37 y.o.   MRN: [id number]  Chief Complaint  Patient presents with   Consult    Referring provider: Loney Laurence, NP  HPI: 37 year old female, never smoked.  No significant past medical history except for obesity.   06/05/2022 Patient presents today for sleep consult. Referred by PCP. She has symptoms of snoring. Snoring will only wake her up if she falls asleep in front of the TV. Sleep can at times be restless but otherwise she feels she sleeps fairly well. Typical bedtime is between 930 and 10:30 PM.  It takes her on average 10 to 15 minutes to fall asleep.  She wakes up 2-3 times a night.  She will at times sleep through her alarm. Because of this, she sets her alarm to start going off at 3:30am and needs to be up by 5:45am.  No previous sleep studies.  No concern for narcolepsy, cataplexy or sleepwalking.  Her Epworth score is 12.  Sleep questionnaire Symptoms-   snoring, restless sleep Prior sleep study- none Bedtime- 9:30-10:30pm  Time to fall asleep- 10-15 mins Nocturnal awakenings- 2-3 times Out of bed/start of day- 6am Weight changes- down 15 lbs  Do you operate heavy machinery- no Do you currently wear CPAP- no Do you current wear oxygen- no Epworth- 12  Social history: Patient is single.  She lives with her 56-year-old son.  She works as a Emergency planning/management officer.  No recent travel in the last 3 months.  She does not drink alcohol or smoke tobacco.   No Known Allergies  Immunization History  Administered Date(s) Administered   Influenza-Unspecified 11/25/2014   PPD Test 07/21/2011, 09/09/2019, 01/04/2021   Tdap 03/12/2015    Past Medical History:  Diagnosis Date   Headache    History of marijuana use    last use- 10/2014   History of trichomonal vaginitis    treated   Hx of chlamydia infection    Vaginal Pap smear, abnormal     Tobacco History: Social History   Tobacco Use  Smoking Status Never   Smokeless Tobacco Never   Counseling given: Not Answered   Outpatient Medications Prior to Visit  Medication Sig Dispense Refill   ibuprofen (ADVIL) 400 MG tablet Take 400 mg by mouth every 4 (four) hours.     ibuprofen (ADVIL) 800 MG tablet Take 800 mg by mouth daily.     methocarbamol (ROBAXIN) 500 MG tablet Take 500 mg by mouth daily.     naproxen (NAPROSYN) 500 MG tablet Take 1 tablet (500 mg total) by mouth 2 (two) times daily as needed for moderate pain. 15 tablet 0   predniSONE (STERAPRED UNI-PAK 21 TAB) 10 MG (21) TBPK tablet 6 tabs for 1 day, then 5 tabs for 1 das, then 4 tabs for 1 day, then 3 tabs for 1 day, 2 tabs for 1 day, then 1 tab for 1 day 21 tablet 0   Prenatal Vit-Fe Fumarate-FA (PRENATAL MULTIVITAMIN) TABS tablet Take 1 tablet by mouth daily at 12 noon. 60 tablet 2   senna-docusate (SENOKOT-S) 8.6-50 MG tablet Take 2 tablets by mouth at bedtime as needed for mild constipation. 30 tablet 1   No facility-administered medications prior to visit.   Review of Systems  Review of Systems  Constitutional: Negative.   HENT: Negative.    Respiratory: Negative.    Cardiovascular: Negative.   Psychiatric/Behavioral:  Positive for sleep disturbance.     Physical  Exam  BP 118/78 (BP Location: Left Arm, Patient Position: Sitting, Cuff Size: Large)   Pulse 73   Ht 5\' 5"  (1.651 m)   Wt 289 lb (131.1 kg)   SpO2 100%   BMI 48.09 kg/m  Physical Exam Constitutional:      Appearance: Normal appearance.  HENT:     Head: Normocephalic and atraumatic.     Mouth/Throat:     Mouth: Mucous membranes are moist.     Pharynx: Oropharynx is clear.  Cardiovascular:     Rate and Rhythm: Normal rate and regular rhythm.  Pulmonary:     Effort: Pulmonary effort is normal.     Breath sounds: Normal breath sounds. No wheezing, rhonchi or rales.  Musculoskeletal:        General: Normal range of motion.  Skin:    General: Skin is warm and dry.  Neurological:     General: No focal  deficit present.     Mental Status: She is alert and oriented to person, place, and time. Mental status is at baseline.  Psychiatric:        Mood and Affect: Mood normal.        Behavior: Behavior normal.        Thought Content: Thought content normal.        Judgment: Judgment normal.      Lab Results:  CBC    Component Value Date/Time   WBC 15.3 (H) 06/15/2015 0530   RBC 3.95 06/15/2015 0530   HGB 11.3 (L) 06/15/2015 0530   HCT 34.0 (L) 06/15/2015 0530   PLT 205 06/15/2015 0530   MCV 86.1 06/15/2015 0530   MCV 90.1 07/21/2011 1838   MCH 28.6 06/15/2015 0530   MCHC 33.2 06/15/2015 0530   RDW 15.4 06/15/2015 0530    BMET    Component Value Date/Time   NA 139 07/21/2011 1837   K 4.1 07/21/2011 1837   CL 105 07/21/2011 1837   CO2 22 07/21/2011 1837   GLUCOSE 80 07/21/2011 1837   BUN 14 07/21/2011 1837   CREATININE 0.85 07/21/2011 1837   CALCIUM 11.1 (H) 07/21/2011 1837    BNP No results found for: "BNP"  ProBNP No results found for: "PROBNP"  Imaging: MM 3D DIAGNOSTIC MAMMOGRAM BILATERAL BREAST  Result Date: 05/24/2022 CLINICAL DATA:  Mass felt on recent physical examination in the outer aspect of each breast. The patient reports that the masses have been present for a long time (many years) and unchanged. She had a left breast ultrasound on 07/11/2011 for lateral left breast palpable thickening which demonstrated prominent fibroglandular tissue with no mass. No family history of breast cancer. EXAM: DIGITAL DIAGNOSTIC BILATERAL MAMMOGRAM WITH TOMOSYNTHESIS; ULTRASOUND LEFT BREAST LIMITED; ULTRASOUND RIGHT BREAST LIMITED TECHNIQUE: Bilateral digital diagnostic mammography and breast tomosynthesis was performed.; Targeted ultrasound examination of the left breast was performed.; Targeted ultrasound examination of the right breast was performed COMPARISON:  Left breast ultrasound dated 07/11/2011. ACR Breast Density Category b: There are scattered areas of fibroglandular  density. FINDINGS: Mammographically normal appearing breasts with no masses or other findings suspicious for malignancy in either breast. On physical exam, the patient has a large, confluent area of fibroglandular tissue deep in the outer aspect of each breast, corresponding to the areas of palpable concern. Targeted ultrasound is performed, showing normal appearing breast tissue throughout the outer left breast in the areas of palpable concern. IMPRESSION: No evidence of malignancy. The areas of palpable concern in each breast are areas of normal fibroglandular  tissue. RECOMMENDATION: Annual screening mammography beginning at age 68. I have discussed the findings and recommendations with the patient. If applicable, a reminder letter will be sent to the patient regarding the next appointment. BI-RADS CATEGORY  1: Negative. Electronically Signed   By: Beckie Salts M.D.   On: 05/24/2022 15:57  [place] LIMITED ULTRASOUND INCLUDING AXILLA LEFT BREAST   Result Date: 05/24/2022 CLINICAL DATA:  Mass felt on recent physical examination in the outer aspect of each breast. The patient reports that the masses have been present for a long time (many years) and unchanged. She had a left breast ultrasound on 07/11/2011 for lateral left breast palpable thickening which demonstrated prominent fibroglandular tissue with no mass. No family history of breast cancer. EXAM: DIGITAL DIAGNOSTIC BILATERAL MAMMOGRAM WITH TOMOSYNTHESIS; ULTRASOUND LEFT BREAST LIMITED; ULTRASOUND RIGHT BREAST LIMITED TECHNIQUE: Bilateral digital diagnostic mammography and breast tomosynthesis was performed.; Targeted ultrasound examination of the left breast was performed.; Targeted ultrasound examination of the right breast was performed COMPARISON:  Left breast ultrasound dated 07/11/2011. ACR Breast Density Category b: There are scattered areas of fibroglandular density. FINDINGS: Mammographically normal appearing breasts with no masses or other findings  suspicious for malignancy in either breast. On physical exam, the patient has a large, confluent area of fibroglandular tissue deep in the outer aspect of each breast, corresponding to the areas of palpable concern. Targeted ultrasound is performed, showing normal appearing breast tissue throughout the outer left breast in the areas of palpable concern. IMPRESSION: No evidence of malignancy. The areas of palpable concern in each breast are areas of normal fibroglandular tissue. RECOMMENDATION: Annual screening mammography beginning at age 25. I have discussed the findings and recommendations with the patient. If applicable, a reminder letter will be sent to the patient regarding the next appointment. BI-RADS CATEGORY  1: Negative. Electronically Signed   By: Beckie Salts M.D.   On: 05/24/2022 15:57  [place] LIMITED ULTRASOUND INCLUDING AXILLA RIGHT BREAST  Result Date: 05/24/2022 CLINICAL DATA:  Mass felt on recent physical examination in the outer aspect of each breast. The patient reports that the masses have been present for a long time (many years) and unchanged. She had a left breast ultrasound on 07/11/2011 for lateral left breast palpable thickening which demonstrated prominent fibroglandular tissue with no mass. No family history of breast cancer. EXAM: DIGITAL DIAGNOSTIC BILATERAL MAMMOGRAM WITH TOMOSYNTHESIS; ULTRASOUND LEFT BREAST LIMITED; ULTRASOUND RIGHT BREAST LIMITED TECHNIQUE: Bilateral digital diagnostic mammography and breast tomosynthesis was performed.; Targeted ultrasound examination of the left breast was performed.; Targeted ultrasound examination of the right breast was performed COMPARISON:  Left breast ultrasound dated 07/11/2011. ACR Breast Density Category b: There are scattered areas of fibroglandular density. FINDINGS: Mammographically normal appearing breasts with no masses or other findings suspicious for malignancy in either breast. On physical exam, the patient has a large, confluent  area of fibroglandular tissue deep in the outer aspect of each breast, corresponding to the areas of palpable concern. Targeted ultrasound is performed, showing normal appearing breast tissue throughout the outer left breast in the areas of palpable concern. IMPRESSION: No evidence of malignancy. The areas of palpable concern in each breast are areas of normal fibroglandular tissue. RECOMMENDATION: Annual screening mammography beginning at age 31. I have discussed the findings and recommendations with the patient. If applicable, a reminder letter will be sent to the patient regarding the next appointment. BI-RADS CATEGORY  1: Negative. Electronically Signed   By: Beckie Salts M.D.   On: 05/24/2022 15:57  Assessment & Plan:   Loud snoring - Patient has symptoms of snoring and restless sleep. She feels she sleeps fairly well, no reports of significant daytime sleepiness. Epworth score 12.  BMI 48.  Concern patient has underlying obstructive sleep apnea, needs home sleep study evaluate.  We reviewed risks of untreated sleep apnea including cardiac arrhythmias, pulmonary hypertension, diabetes and stroke.  We also discussed treatment options including weight loss, oral appliance, CPAP therapy or referral to ENT for possible surgical options.  Encourage patient focus on side sleeping position.  Advised against driving if experiencing excessive daytime sleepiness or fatigue.  Follow-up 1 to 2 weeks after sleep study to review results and treatment options if needed.     Glenford Bayley, NP 06/05/2022

## 2022-06-06 NOTE — Progress Notes (Signed)
Reviewed and agree with assessment/plan.   Lamonica Trueba, MD [facility] 06/06/2022, 10:05 AM Pager:  [phone]  

## 2022-06-14 ENCOUNTER — Ambulatory Visit
Admission: RE | Admit: 2022-06-14 | Discharge: 2022-06-14 | Disposition: A | Discharge status: Home / Self Care | Payer: BC Managed Care – PPO | Source: Ambulatory Visit | Attending: Family Medicine | Admitting: Family Medicine

## 2022-06-14 ENCOUNTER — Other Ambulatory Visit: Payer: Self-pay | Admitting: Family Medicine

## 2022-06-14 DIAGNOSIS — M545 Low back pain, unspecified: Secondary | ICD-10-CM

## 2022-06-29 ENCOUNTER — Ambulatory Visit (INDEPENDENT_AMBULATORY_CARE_PROVIDER_SITE_OTHER): Payer: BC Managed Care – PPO | Admitting: Primary Care

## 2022-06-29 DIAGNOSIS — R0683 Snoring: Secondary | ICD-10-CM

## 2022-06-29 DIAGNOSIS — G4733 Obstructive sleep apnea (adult) (pediatric): Secondary | ICD-10-CM

## 2022-07-18 NOTE — Progress Notes (Signed)
Patient has mild OSA, she had average 10 apneas an hour. Needs OV to discuss treatment if she would like to consider oral appliance or CPAP

## 2022-08-22 ENCOUNTER — Encounter: Payer: Self-pay | Admitting: Primary Care

## 2022-08-22 ENCOUNTER — Ambulatory Visit (INDEPENDENT_AMBULATORY_CARE_PROVIDER_SITE_OTHER): Payer: BC Managed Care – PPO | Admitting: Primary Care

## 2022-08-22 VITALS — BP 122/74 | HR 68 | Temp 98.8°F | Ht 65.0 in | Wt 255.0 lb

## 2022-08-22 DIAGNOSIS — G4730 Sleep apnea, unspecified: Secondary | ICD-10-CM

## 2022-08-22 NOTE — Assessment & Plan Note (Addendum)
-   Patient was seen for sleep consult in April 2024 due to snoring symptoms.  She reports sleeping fairly well at night. She had home sleep study on 06/16/22 that showed mild obstructive sleep apnea, AHI 3% (RDI) 10/hour with normal oxygen levels.  Reviewed treatment options, CPAP is not necessary at this time as patient is minimally symptomatic and cardiovascular risk is minimal.  Not currently a candidate for oral appliance due to potential need for braces.  Encourage side sleeping position and weight loss. FU as needed if sleep symptoms worsen.

## 2022-08-22 NOTE — Progress Notes (Signed)
@Patient  ID: Maureen Lynch, female    DOB: 1985/07/31, 37 y.o.   MRN: [id number]  Chief Complaint  Patient presents with   Follow-up    Review HST    Referring provider: No ref. provider found  HPI: 37 year old female, never smoked.  No significant past medical history except for obesity.   Previous [facility] pulmonary encounter: 06/05/2022 Patient presents today for sleep consult. Referred by PCP. She has symptoms of snoring. Snoring will only wake her up if she falls asleep in front of the TV. Sleep can at times be restless but otherwise she feels she sleeps fairly well. Typical bedtime is between 930 and 10:30 PM.  It takes her on average 10 to 15 minutes to fall asleep.  She wakes up 2-3 times a night.  She will at times sleep through her alarm. Because of this, she sets her alarm to start going off at 3:30am and needs to be up by 5:45am.  No previous sleep studies.  No concern for narcolepsy, cataplexy or sleepwalking.  Her Epworth score is 12.  Sleep questionnaire Symptoms-  snoring, restless sleep Prior sleep study- none Bedtime- 9:30-10:30pm  Time to fall asleep- 10-15 mins Nocturnal awakenings- 2-3 times Out of bed/start of day- 6am Weight changes- down 15 lbs  Do you operate heavy machinery- no Do you currently wear CPAP- no Do you current wear oxygen- no Epworth- 12  Social history: Patient is single.  She lives with her 64-year-old son.  She works as a Emergency planning/management officer.  No recent travel in the last 3 months.  She does not drink alcohol or smoke tobacco.  08/22/2022- Interim hx  Presents today to go over sleep study results. HST 06/16/22 showed mild OSA, AHI 3% (RDI) 10/hour with Spo2 low 92%.  We reviewed sleep study results and discussed treatment options. Sleep apnea and oxygen levels are not significant enough where treatment with CPAP is needed. Cadiovascular disease risk is minimal. Patient is looking at getting braces so unlikely good candidate for treatment of sleep apnea  with oral appliance. She is motivated to lose weight.    No Known Allergies  Immunization History  Administered Date(s) Administered   Influenza-Unspecified 11/25/2014   PPD Test 07/21/2011, 09/09/2019, 01/04/2021   Tdap 03/12/2015    Past Medical History:  Diagnosis Date   Headache    History of marijuana use    last use- 10/2014   History of trichomonal vaginitis    treated   Hx of chlamydia infection    Vaginal Pap smear, abnormal     Tobacco History: Social History   Tobacco Use  Smoking Status Never  Smokeless Tobacco Never   Counseling given: Not Answered   Outpatient Medications Prior to Visit  Medication Sig Dispense Refill   ibuprofen (ADVIL) 400 MG tablet Take 400 mg by mouth every 4 (four) hours.     ibuprofen (ADVIL) 800 MG tablet Take 800 mg by mouth daily.     methocarbamol (ROBAXIN) 500 MG tablet Take 500 mg by mouth daily.     No facility-administered medications prior to visit.      Review of Systems  Review of Systems  Constitutional: Negative.      Physical Exam  BP 122/74 (BP Location: Right Arm, Patient Position: Sitting, Cuff Size: Large)   Pulse 68   Temp 98.8 F (37.1 C) (Oral)   Ht 5\' 5"  (1.651 m)   Wt 255 lb (115.7 kg)   SpO2 100%   BMI 42.43  kg/m  Physical Exam Constitutional:      Appearance: Normal appearance.  HENT:     Head: Normocephalic and atraumatic.     Mouth/Throat:     Pharynx: Oropharynx is clear.     Comments: Mallamapti class III Cardiovascular:     Rate and Rhythm: Normal rate and regular rhythm.  Pulmonary:     Effort: Pulmonary effort is normal.     Breath sounds: Normal breath sounds.  Musculoskeletal:        General: Normal range of motion.  Skin:    General: Skin is warm and dry.  Neurological:     General: No focal deficit present.     Mental Status: She is alert and oriented to person, place, and time. Mental status is at baseline.  Psychiatric:        Mood and Affect: Mood normal.         Behavior: Behavior normal.        Thought Content: Thought content normal.        Judgment: Judgment normal.      Lab Results:  CBC    Component Value Date/Time   WBC 15.3 (H) 06/15/2015 0530   RBC 3.95 06/15/2015 0530   HGB 11.3 (L) 06/15/2015 0530   HCT 34.0 (L) 06/15/2015 0530   PLT 205 06/15/2015 0530   MCV 86.1 06/15/2015 0530   MCV 90.1 07/21/2011 1838   MCH 28.6 06/15/2015 0530   MCHC 33.2 06/15/2015 0530   RDW 15.4 06/15/2015 0530    BMET    Component Value Date/Time   NA 139 07/21/2011 1837   K 4.1 07/21/2011 1837   CL 105 07/21/2011 1837   CO2 22 07/21/2011 1837   GLUCOSE 80 07/21/2011 1837   BUN 14 07/21/2011 1837   CREATININE 0.85 07/21/2011 1837   CALCIUM 11.1 (H) 07/21/2011 1837    BNP No results found for: "BNP"  ProBNP No results found for: "PROBNP"  Imaging: No results found.   Assessment & Plan:   Mild sleep apnea - Patient was seen for sleep consult in April 2024 due to snoring symptoms.  She reports sleeping fairly well at night. She had home sleep study on 06/16/22 that showed mild obstructive sleep apnea, AHI 3% (RDI) 10/hour with normal oxygen levels.  Reviewed treatment options, CPAP is not necessary at this time as patient is minimally symptomatic and cardiovascular risk is minimal.  Not currently a candidate for oral appliance due to potential need for braces.  Encourage side sleeping position and weight loss. FU as needed if sleep symptoms worsen.    Glenford Bayley, NP 08/22/2022

## 2022-08-22 NOTE — Patient Instructions (Signed)
Home sleep study in April showed mild obstructive sleep apnea normal oxygen levels  Sleep apnea is not significant enough that I would recommend you be started on a CPAP unless very symptomatic  For now focus on side sleeping position or elevate head with wedge pillow and work on weight loss  If sleep symptoms worsen return and can consider CPAP initiation  Follow-up As needed   Sleep Apnea Sleep apnea affects breathing during sleep. It causes breathing to stop for 10 seconds or more, or to become shallow. People with sleep apnea usually snore loudly. It can also increase the risk of: Heart attack. Stroke. Being very overweight (obese). Diabetes. Heart failure. Irregular heartbeat. High blood pressure. The goal of treatment is to help you breathe normally again. What are the causes?  The most common cause of this condition is a collapsed or blocked airway. There are three kinds of sleep apnea: Obstructive sleep apnea. This is caused by a blocked or collapsed airway. Central sleep apnea. This happens when the brain does not send the right signals to the muscles that control breathing. Mixed sleep apnea. This is a combination of obstructive and central sleep apnea. What increases the risk? Being overweight. Smoking. Having a small airway. Being older. Being female. Drinking alcohol. Taking medicines to calm yourself (sedatives or tranquilizers). Having family members with the condition. Having a tongue or tonsils that are larger than normal. What are the signs or symptoms? Trouble staying asleep. Loud snoring. Headaches in the morning. Waking up gasping. Dry mouth or sore throat in the morning. Being sleepy or tired during the day. If you are sleepy or tired during the day, you may also: Not be able to focus your mind (concentrate). Forget things. Get angry a lot and have mood swings. Feel sad (depressed). Have changes in your personality. Have less interest in sex, if  you are female. Be unable to have an erection, if you are female. How is this treated?  Sleeping on your side. Using a medicine to get rid of mucus in your nose (decongestant). Avoiding the use of alcohol, medicines to help you relax, or certain pain medicines (narcotics). Losing weight, if needed. Changing your diet. Quitting smoking. Using a machine to open your airway while you sleep, such as: An oral appliance. This is a mouthpiece that shifts your lower jaw forward. A CPAP device. This device blows air through a mask when you breathe out (exhale). An EPAP device. This has valves that you put in each nostril. A BIPAP device. This device blows air through a mask when you breathe in (inhale) and breathe out. Having surgery if other treatments do not work. Follow these instructions at home: Lifestyle Make changes that your doctor recommends. Eat a healthy diet. Lose weight if needed. Avoid alcohol, medicines to help you relax, and some pain medicines. Do not smoke or use any products that contain nicotine or tobacco. If you need help quitting, ask your doctor. General instructions Take over-the-counter and prescription medicines only as told by your doctor. If you were given a machine to use while you sleep, use it only as told by your doctor. If you are having surgery, make sure to tell your doctor you have sleep apnea. You may need to bring your device with you. Keep all follow-up visits. Contact a doctor if: The machine that you were given to use during sleep bothers you or does not seem to be working. You do not get better. You get worse. Get help  right away if: Your chest hurts. You have trouble breathing in enough air. You have an uncomfortable feeling in your back, arms, or stomach. You have trouble talking. One side of your body feels weak. A part of your face is hanging down. These symptoms may be an emergency. Get help right away. Call your local emergency services  (911 in the [place] Do not wait to see if the symptoms will go away. Do not drive yourself to the hospital. Summary This condition affects breathing during sleep. The most common cause is a collapsed or blocked airway. The goal of treatment is to help you breathe normally while you sleep. This information is not intended to replace advice given to you by your health care provider. Make sure you discuss any questions you have with your health care provider. Document Revised: 09/15/2020 Document Reviewed: 01/16/2020 Elsevier Patient Education  2024 [employer].

## 2022-08-22 NOTE — Progress Notes (Signed)
Reviewed and agree with assessment/plan.   Coralyn Helling, MD [facility] 08/22/2022, 12:11 PM Pager:  [phone]

## 2023-04-17 ENCOUNTER — Ambulatory Visit (HOSPITAL_COMMUNITY)
Admission: EM | Admit: 2023-04-17 | Discharge: 2023-04-17 | Disposition: A | Discharge status: Home / Self Care | Payer: 59 | Attending: Family Medicine | Admitting: Family Medicine

## 2023-04-17 ENCOUNTER — Encounter (HOSPITAL_COMMUNITY): Payer: Self-pay

## 2023-04-17 DIAGNOSIS — M546 Pain in thoracic spine: Secondary | ICD-10-CM | POA: Diagnosis not present

## 2023-04-17 DIAGNOSIS — R519 Headache, unspecified: Secondary | ICD-10-CM | POA: Diagnosis not present

## 2023-04-17 LAB — POC COVID19/FLU A&B COMBO
Covid Antigen, POC: NEGATIVE
Influenza A Antigen, POC: NEGATIVE
Influenza B Antigen, POC: NEGATIVE

## 2023-04-17 NOTE — Discharge Instructions (Signed)
 You were seen today for headache and back pain.  Your flu and covid swab were negative today.  I recommend you continue over the counter medications for headache and back pain. If you have worsening headache, or develop nausea and vomiting, then please return or go to the ER for further evaluation.

## 2023-04-17 NOTE — ED Provider Notes (Signed)
 [facility]    CSN: [id number] Arrival date & time: 04/17/23  1117      History   Chief Complaint Chief Complaint  Patient presents with   Back Pain   Headache    HPI Gladiola Madore is a 38 y.o. female.    Back Pain Associated symptoms: headaches   Associated symptoms: no fever   Headache Associated symptoms: back pain, congestion and cough   Associated symptoms: no fever    Patient is here for a headache that started yesterday.  She usually takes home meds to get rid of it.  It was still present this morning.  She noted yesterday that with coughing she would get a pain in her mid/upper back.   No fevers, but some chills.  Mild runny nose, congestion.  No cough today.  She was exposed to the flu.       Past Medical History:  Diagnosis Date   Headache    History of marijuana use    last use- 10/2014   History of trichomonal vaginitis    treated   Hx of chlamydia infection    Vaginal Pap smear, abnormal     Patient Active Problem List   Diagnosis Date Noted   Mild sleep apnea 08/22/2022   Loud snoring 06/05/2022   SVD (spontaneous vaginal delivery) 06/14/2015   Periurethral laceration, delivered, current hospitalization 06/14/2015   Obstetric vaginal laceration, delivered, current hospitalization 06/14/2015   Obstetric labial laceration, delivered, current hospitalization 06/14/2015   Normal labor 06/13/2015   Morbid obesity with BMI of 45.0-49.9, adult (HCC) 06/13/2015    Past Surgical History:  Procedure Laterality Date   KNEE ARTHROCENTESIS     THERAPEUTIC ABORTION     TONSILLECTOMY AND ADENOIDECTOMY      OB History     Gravida  1   Para  1   Term  1   Preterm      AB      Living  1      SAB      IAB      Ectopic      Multiple  0   Live Births  1            Home Medications    Prior to Admission medications   Medication Sig Start Date End Date Taking? Authorizing Provider  ibuprofen (ADVIL) 800 MG  tablet Take 800 mg by mouth daily. 05/09/22   [provider]  methocarbamol (ROBAXIN) 500 MG tablet Take 500 mg by mouth daily. 05/09/22   [provider]    Family History Family History  Problem Relation Age of Onset   Diabetes Mother    Healthy Father    Hypertension Brother     Social History Social History   Tobacco Use   Smoking status: Never   Smokeless tobacco: Never  Vaping Use   Vaping status: Never Used  Substance Use Topics   Alcohol use: Not Currently    Comment: last use Aug 2016   Drug use: Yes    Types: Marijuana    Comment: last use Sept 2016     Allergies   Patient has no known allergies.   Review of Systems Review of Systems  Constitutional:  Positive for chills. Negative for fever.  HENT:  Positive for congestion.   Respiratory:  Positive for cough.   Musculoskeletal:  Positive for back pain.  Neurological:  Positive for headaches.     Physical Exam Triage Vital Signs ED  Triage Vitals  Encounter Vitals Group     BP 04/17/23 1233 121/81     Systolic BP Percentile --      Diastolic BP Percentile --      Pulse Rate 04/17/23 1233 69     Resp 04/17/23 1233 16     Temp 04/17/23 1233 98.4 F (36.9 C)     Temp Source 04/17/23 1233 Oral     SpO2 04/17/23 1233 97 %     Weight 04/17/23 1233 257 lb (116.6 kg)     Height 04/17/23 1233 5\' 5"  (1.651 m)     Head Circumference --      Peak Flow --      Pain Score 04/17/23 1234 4     Pain Loc --      Pain Education --      Exclude from Growth Chart --    No data found.  Updated Vital Signs BP 121/81 (BP Location: Left Arm)   Pulse 69   Temp 98.4 F (36.9 C) (Oral)   Resp 16   Ht 5\' 5"  (1.651 m)   Wt 116.6 kg   LMP 04/16/2023 (Exact Date)   SpO2 97%   Breastfeeding No   BMI 42.77 kg/m   Visual Acuity Right Eye Distance:   Left Eye Distance:   Bilateral Distance:    Right Eye Near:   Left Eye Near:    Bilateral Near:     Physical Exam Constitutional:       General: She is not in acute distress.    Appearance: She is well-developed and normal weight. She is not ill-appearing or toxic-appearing.  HENT:     Head: Normocephalic and atraumatic.     Nose:     Left Sinus: Maxillary sinus tenderness present.     Mouth/Throat:     Mouth: Mucous membranes are moist.  Eyes:     Extraocular Movements: Extraocular movements intact.  Cardiovascular:     Rate and Rhythm: Normal rate and regular rhythm.  Pulmonary:     Effort: Pulmonary effort is normal.     Breath sounds: Rhonchi present. No wheezing.  Musculoskeletal:     Cervical back: Normal range of motion and neck supple.     Comments: No TTP to the mid back/spine  Lymphadenopathy:     Cervical: No cervical adenopathy.  Neurological:     Mental Status: She is alert.      UC Treatments / Results  Labs (all labs ordered are listed, but only abnormal results are displayed) Labs Reviewed  POC COVID19/FLU A&B COMBO    EKG   Radiology No results found.  Procedures Procedures (including critical care time)  Medications Ordered in UC Medications - No data to display  Initial Impression / Assessment and Plan / UC Course  I have reviewed the triage vital signs and the nursing notes.  Pertinent labs & imaging results that were available during my care of the patient were reviewed by me and considered in my medical decision making (see chart for details).   Final Clinical Impressions(s) / UC Diagnoses   Final diagnoses:  Nonintractable headache, unspecified chronicity pattern, unspecified headache type  Acute midline thoracic back pain     Discharge Instructions      You were seen today for headache and back pain.  Your flu and covid swab were negative today.  I recommend you continue over the counter medications for headache and back pain. If you have worsening headache, or develop nausea  and vomiting, then please return or go to the ER for further evaluation.     ED  Prescriptions   None    PDMP not reviewed this encounter.   Jannifer Franklin, MD 04/17/23 1324

## 2023-04-17 NOTE — ED Triage Notes (Signed)
 Patient here today with c/o headache and mid to upper back pain since yesterday. Patient has taken IBU and Methocarbamol with no relief. Patient has h/o LB pain.

## 2023-04-24 ENCOUNTER — Other Ambulatory Visit: Payer: Self-pay

## 2023-04-24 ENCOUNTER — Encounter: Payer: Self-pay | Admitting: Physical Therapy

## 2023-04-24 ENCOUNTER — Ambulatory Visit: Payer: 59 | Attending: Family Medicine | Admitting: Physical Therapy

## 2023-04-24 VITALS — BP 139/80 | HR 72

## 2023-04-24 DIAGNOSIS — M25552 Pain in left hip: Secondary | ICD-10-CM | POA: Diagnosis present

## 2023-04-24 DIAGNOSIS — R29898 Other symptoms and signs involving the musculoskeletal system: Secondary | ICD-10-CM | POA: Insufficient documentation

## 2023-04-24 DIAGNOSIS — M5459 Other low back pain: Secondary | ICD-10-CM | POA: Diagnosis present

## 2023-04-24 NOTE — Patient Instructions (Signed)
 Access Code: [email or web address] URL: [email or web address] Date: 04/24/2023 Prepared by: Camille Bal  Exercises - Supine Bridge  - 1 x daily - 5 x weekly - 3 sets - 10 reps - Clamshell  - 1 x daily - 5 x weekly - 3 sets - 10 reps - Supine Hip Adduction Isometric with Ball  - 1 x daily - 5 x weekly - 3 sets - 10 reps - 1-2 seconds hold

## 2023-04-24 NOTE — Therapy (Unsigned)
 OUTPATIENT PHYSICAL THERAPY THORACOLUMBAR EVALUATION   Patient Name: Maureen Lynch MRN: [id number] DOB:[birth year], 38 y.o., female Today's Date: 04/25/2023  END OF SESSION:  PT End of Session - 04/24/23 1415     Visit Number 1    Number of Visits 9   8+eval   Date for PT Re-Evaluation 06/29/23   pushed out due to possible cancellations   Authorization Type [employer]    PT Start Time 1406    PT Stop Time 1452    PT Time Calculation (min) 46 min    Activity Tolerance Patient tolerated treatment well    Behavior During Therapy [facility] for tasks assessed/performed             Past Medical History:  Diagnosis Date   Headache    History of marijuana use    last use- 10/2014   History of trichomonal vaginitis    treated   Hx of chlamydia infection    Vaginal Pap smear, abnormal    Past Surgical History:  Procedure Laterality Date   KNEE ARTHROCENTESIS     THERAPEUTIC ABORTION     TONSILLECTOMY AND ADENOIDECTOMY     Patient Active Problem List   Diagnosis Date Noted   Mild sleep apnea 08/22/2022   Loud snoring 06/05/2022   SVD (spontaneous vaginal delivery) 06/14/2015   Periurethral laceration, delivered, current hospitalization 06/14/2015   Obstetric vaginal laceration, delivered, current hospitalization 06/14/2015   Obstetric labial laceration, delivered, current hospitalization 06/14/2015   Normal labor 06/13/2015   Morbid obesity with BMI of 45.0-49.9, adult (HCC) 06/13/2015    PCP: Loney Laurence, NP  REFERRING PROVIDER: Loney Laurence, NP  REFERRING DIAG: M41.9 (ICD-10-CM) - Scoliosis, unspecified  Rationale for Evaluation and Treatment: Rehabilitation  THERAPY DIAG:  Other low back pain  Pain in left hip  Other symptoms and signs involving the musculoskeletal system  ONSET DATE: 2 years ago  SUBJECTIVE:                                                                                                                                                                                            SUBJECTIVE STATEMENT: Pt states her pain has been ongoing for almost 2 years.  She cannot recall exactly when, but had a fall down a ramp about 2 years ago and was sore after but did not think she was hurt at the time.  Her back and left hip hurt primarily and sometimes this makes it hard to walk and her left hip will hurt worse than her back.  Recently (between January and February) the right groin started to ache.  She states she pushed the referral to PT off hoping it would just go away.  PERTINENT HISTORY:  Vaginal delivery, mild sleep apnea, left MCL, ACL, and medial meniscal repair at 38 years old  PAIN:  Are you having pain? Yes: NPRS scale: 1-2 Pain location: low back  Pain description: achy Aggravating factors: prolonged positions Relieving factors: switching positions  PRECAUTIONS: None  RED FLAGS: None   WEIGHT BEARING RESTRICTIONS: No  FALLS:  Has patient fallen in last 6 months? No  LIVING ENVIRONMENT: Lives with: lives with their son - 48 years old Lives in: House/apartment Stairs: Yes: External: 3 steps; none Has following equipment at home: Ramped entry  OCCUPATION: Elementary school teacher/childcare provider  PLOF: Independent  PATIENT GOALS: "pain relief"  NEXT MD VISIT: none  OBJECTIVE:  Note: Objective measures were completed at Evaluation unless otherwise noted.  DIAGNOSTIC FINDINGS:  Lumbar x-ray 06/18/2022: IMPRESSION: 1. Mild degenerative disc disease at L3-L4. 2. Broad-based dextroscoliotic curvature.  PATIENT SURVEYS:  Modified Oswestry 17/50 = 34% moderate perceived disability   COGNITION: Overall cognitive status: Within functional limits for tasks assessed     SENSATION: Light touch: WFL  POSTURE: rounded shoulders, forward head, and pt alternating pelvic weight shift frequently throughout evaluation from left to right  PALPATION: TTP at L3-L5 SP, more tender over lateral left  hip joint, tender in soft tissues over bilateral iliac crest, soreness w/ palpation in medial adductor region mostly on right  LUMBAR ROM:   AROM eval  Flexion 30  Extension 11  Right lateral flexion WNL  Left lateral flexion "  Right rotation "  Left rotation "; Pain in left back and hip   (Blank rows = not tested)  LOWER EXTREMITY ROM:     Active  Right eval Left eval  Hip flexion WNL  Hip extension   Hip abduction   Hip adduction   Hip internal rotation   Hip external rotation   Knee flexion   Knee extension   Ankle dorsiflexion   Ankle plantarflexion    Ankle inversion    Ankle eversion     (Blank rows = not tested)  LOWER EXTREMITY MMT:    MMT Right eval Left eval  Hip flexion Grossly 4+/5  Hip extension   Hip abduction   Hip adduction   Hip internal rotation   Hip external rotation   Knee flexion   Knee extension   Ankle dorsiflexion   Ankle plantarflexion    Ankle inversion    Ankle eversion     (Blank rows = not tested)  LUMBAR SPECIAL TESTS:  SI Compression/distraction test: Negative, FABER test: + bilaterally, more intense in lumbar spine on left side, and Trendelenburg sign: + and compensated bilaterally  FUNCTIONAL TESTS:  5 times sit to stand: 13.37 sec no UE support  GAIT: Distance walked: various clinic distances Assistive device utilized: None Level of assistance: Complete Independence Comments: No LOB or obvious instability or antalgic gait.  TREATMENT DATE: 04/24/2023  Initiated HEP: -Supine bridges x12 -Side-lying clamshells x10 each side, PT adjusting position to improve alignment of hips and low back, cued to decrease ROM to better isolate glut med -Hook-lying hip adduction w/ folded pillow x20 using 2 second hold  PATIENT EDUCATION:  Education details: PT POC, assessments used and goals to be set.   Initial HEP.  Discussed sleep posture to improve body alignment.  Discussed brief anatomy as it relates to various areas of pain she is experiencing. Person educated: Patient Education method: Explanation, Demonstration, Tactile cues, Verbal cues, and Handouts Education comprehension: verbalized understanding  HOME EXERCISE PROGRAM: Access Code: [email or web address] URL: [email or web address] Date: 04/24/2023 Prepared by: Camille Bal  Exercises - Supine Bridge  - 1 x daily - 5 x weekly - 3 sets - 10 reps - Clamshell  - 1 x daily - 5 x weekly - 3 sets - 10 reps - Supine Hip Adduction Isometric with Ball  - 1 x daily - 5 x weekly - 3 sets - 10 reps - 1-2 seconds hold  ASSESSMENT:  CLINICAL IMPRESSION: Patient is a 38 y.o. female who was seen today for physical therapy evaluation and treatment for chronic low back and hip pain.  Pt has a significant PMH of vaginal delivery, mild sleep apnea, left MCL, ACL, and medial meniscal repair at 38 years old.  Identified impairments include chronic low back pain, mild postural changes, TTP, pain with left rotation, and positive bilateral FABER indicating lumbar impairment.  5xSTS was insignificant for fall risk and functional LE weakness.  She would benefit from skilled PT to address impairments as noted and progress towards long term goals.  OBJECTIVE IMPAIRMENTS: decreased activity tolerance, improper body mechanics, postural dysfunction, and pain.   ACTIVITY LIMITATIONS: carrying, lifting, and bending  PARTICIPATION LIMITATIONS: driving, community activity, and occupation  PERSONAL FACTORS: Fitness, Time since onset of injury/illness/exacerbation, and 1 comorbidity: prior left knee surgery  are also affecting patient's functional outcome.   REHAB POTENTIAL: Good  CLINICAL DECISION MAKING: Evolving/moderate complexity  EVALUATION COMPLEXITY: Moderate   GOALS: Goals reviewed with patient? Yes  SHORT TERM GOALS: Target date:  05/25/2023  Pt will be independent and compliant with introductory strength and stretching HEP in order to maintain functional progress and improve mobility. Baseline:  Established on eval. Goal status: INITIAL  LONG TERM GOALS: Target date: 06/22/2023  Pt will be independent and compliant with advanced and finalized strength and stretching HEP in order to maintain functional progress and improve mobility. Baseline: Established on eval. Goal status: INITIAL  2.  Pt will improve modified oswestry score to </= 24% in order to demonstrate improved pain management and function. Baseline: 34% Goal status: INITIAL  3.  Patient will maintain full left back rotation without pain in order to improve general mobility tolerance. Baseline: pain on left rotation w/ normal ROM Goal status: INITIAL  PLAN:  PT FREQUENCY: 1x/week  PT DURATION: 8 weeks  PLANNED INTERVENTIONS: [id number]- PT Re-evaluation, 97110-Therapeutic exercises, 97530- Therapeutic activity, 97112- Neuromuscular re-education, 97535- Self Care, [id number]- Manual therapy, [id number]- Electrical stimulation (manual), Patient/Family education, Taping, Dry Needling, Joint mobilization, Spinal mobilization, Cryotherapy, and Moist heat.  PLAN FOR NEXT SESSION: Pelvic isometric holds/MET to pelvis, add to HEP for core and low back strengthening and stretching, hip joint distraction for pain relief, pain management techniques for home   Sadie Haber, PT, DPT 04/25/2023, 1:28 PM

## 2023-05-01 ENCOUNTER — Encounter: Payer: Self-pay | Admitting: Physical Therapy

## 2023-05-01 ENCOUNTER — Ambulatory Visit: Admitting: Physical Therapy

## 2023-05-01 DIAGNOSIS — R29898 Other symptoms and signs involving the musculoskeletal system: Secondary | ICD-10-CM

## 2023-05-01 DIAGNOSIS — M5459 Other low back pain: Secondary | ICD-10-CM | POA: Diagnosis not present

## 2023-05-01 DIAGNOSIS — M25552 Pain in left hip: Secondary | ICD-10-CM

## 2023-05-01 NOTE — Therapy (Signed)
 OUTPATIENT PHYSICAL THERAPY THORACOLUMBAR TREATMENT   Patient Name: Maureen Lynch MRN: [id number] DOB:Sep 16, 1985, 38 y.o., female Today's Date: 05/01/2023  END OF SESSION:  PT End of Session - 05/01/23 1457     Visit Number 2    Number of Visits 9   8+eval   Date for PT Re-Evaluation 06/29/23   pushed out due to possible cancellations   Authorization Type [employer]    PT Start Time [phone]    PT Stop Time 1530    PT Time Calculation (min) 42 min    Activity Tolerance Patient tolerated treatment well    Behavior During Therapy [facility] for tasks assessed/performed             Past Medical History:  Diagnosis Date   Headache    History of marijuana use    last use- 10/2014   History of trichomonal vaginitis    treated   Hx of chlamydia infection    Vaginal Pap smear, abnormal    Past Surgical History:  Procedure Laterality Date   KNEE ARTHROCENTESIS     THERAPEUTIC ABORTION     TONSILLECTOMY AND ADENOIDECTOMY     Patient Active Problem List   Diagnosis Date Noted   Mild sleep apnea 08/22/2022   Loud snoring 06/05/2022   SVD (spontaneous vaginal delivery) 06/14/2015   Periurethral laceration, delivered, current hospitalization 06/14/2015   Obstetric vaginal laceration, delivered, current hospitalization 06/14/2015   Obstetric labial laceration, delivered, current hospitalization 06/14/2015   Normal labor 06/13/2015   Morbid obesity with BMI of 45.0-49.9, adult (HCC) 06/13/2015    PCP: Loney Laurence, NP  REFERRING PROVIDER: Loney Laurence, NP  REFERRING DIAG: M41.9 (ICD-10-CM) - Scoliosis, unspecified  Rationale for Evaluation and Treatment: Rehabilitation  THERAPY DIAG:  Other low back pain  Pain in left hip  Other symptoms and signs involving the musculoskeletal system  ONSET DATE: 2 years ago  SUBJECTIVE:                                                                                                                                                                                            SUBJECTIVE STATEMENT: Pt reports she was thinking about if she had an injury and states 2 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  She is unsure this is related.  She has done a lot of walking today and her pain has eased off.  She has not fallen and denies acute changes.  She is unsure she is doing HEP correctly and would  like to review this today.  She reports brief but intense left hip discomfort following bridges.  PERTINENT HISTORY:  Vaginal delivery, mild sleep apnea, left MCL, ACL, and medial meniscal repair at 38 years old  PAIN:  Are you having pain? Yes: NPRS scale: 1 Pain location: low back  Pain description: achy Aggravating factors: prolonged positions Relieving factors: switching positions  PRECAUTIONS: None  RED FLAGS: None   WEIGHT BEARING RESTRICTIONS: No  FALLS:  Has patient fallen in last 6 months? No  LIVING ENVIRONMENT: Lives with: lives with their son - 67 years old Lives in: House/apartment Stairs: Yes: External: 3 steps; none Has following equipment at home: Ramped entry  OCCUPATION: Elementary school teacher/childcare provider  PLOF: Independent  PATIENT GOALS: "pain relief"  NEXT MD VISIT: none  OBJECTIVE:  Note: Objective measures were completed at Evaluation unless otherwise noted.  DIAGNOSTIC FINDINGS:  Lumbar x-ray 06/18/2022: IMPRESSION: 1. Mild degenerative disc disease at L3-L4. 2. Broad-based dextroscoliotic curvature.  PATIENT SURVEYS:  Modified Oswestry 17/50 = 34% moderate perceived disability   COGNITION: Overall cognitive status: Within functional limits for tasks assessed     SENSATION: Light touch: WFL  POSTURE: rounded shoulders, forward head, and pt alternating pelvic weight shift frequently throughout evaluation from left to right  PALPATION: TTP at L3-L5 SP, more tender over  lateral left hip joint, tender in soft tissues over bilateral iliac crest, soreness w/ palpation in medial adductor region mostly on right  LUMBAR ROM:   AROM eval  Flexion 30  Extension 11  Right lateral flexion WNL  Left lateral flexion "  Right rotation "  Left rotation "; Pain in left back and hip   (Blank rows = not tested)  LOWER EXTREMITY ROM:     Active  Right eval Left eval  Hip flexion WNL  Hip extension   Hip abduction   Hip adduction   Hip internal rotation   Hip external rotation   Knee flexion   Knee extension   Ankle dorsiflexion   Ankle plantarflexion    Ankle inversion    Ankle eversion     (Blank rows = not tested)  LOWER EXTREMITY MMT:    MMT Right eval Left eval  Hip flexion Grossly 4+/5  Hip extension   Hip abduction   Hip adduction   Hip internal rotation   Hip external rotation   Knee flexion   Knee extension   Ankle dorsiflexion   Ankle plantarflexion    Ankle inversion    Ankle eversion     (Blank rows = not tested)  LUMBAR SPECIAL TESTS:  SI Compression/distraction test: Negative, FABER test: + bilaterally, more intense in lumbar spine on left side, and Trendelenburg sign: + and compensated bilaterally  FUNCTIONAL TESTS:  5 times sit to stand: 13.37 sec no UE support  GAIT: Distance walked: various clinic distances Assistive device utilized: None Level of assistance: Complete Independence Comments: No LOB or obvious instability or antalgic gait.  TREATMENT DATE: 05/01/2023  Reviewed HEP: -Supine bridges x10; edu to break into smaller sets to see if tolerance improves vs not doing exercise if pain continues to worsen -Side-lying clamshells x10 each side, PT modified knee extension angle to improve hamstring tightness reported during movement -Hook-lying hip adduction w/ folded pillow x10 using 3 second  hold, cued for breathing and TrA engagement to improve pelvic stability during movement -Pelvic muscle energy using dowel rod flexion/extension x5 each LE each direction holding x10 seconds each rep -Alternating heel taps 2x20, mild tightness in left posterior hip -Hook-lying LTRs x20 -Sciatic nerve glide (left only) 2 rounds x10 -Seated hamstring stretch 2x45 seconds each LE  PATIENT EDUCATION:  Education details: Sciatic nerve and anatomy.  Additions to HEP. Person educated: Patient Education method: Explanation, Demonstration, Tactile cues, Verbal cues, and Handouts Education comprehension: verbalized understanding  HOME EXERCISE PROGRAM: Access Code: [email or web address] URL: [email or web address] Date: 05/01/2023 Prepared by: Camille Bal  Exercises - Supine Bridge  - 1 x daily - 5 x weekly - 3 sets - 10 reps - Clamshell  - 1 x daily - 5 x weekly - 3 sets - 10 reps - Supine Hip Adduction Isometric with Ball  - 1 x daily - 5 x weekly - 3 sets - 10 reps - 1-2 seconds hold - Pelvic Muscle Energy Technique With Flexion and Extension  - 1 x daily - 5 x weekly - 2 sets - 5 reps - 10 seconds hold - Supine 90/90 Alternating Toe Touch  - 1 x daily - 5 x weekly - 2 sets - 10 reps - Supine Lower Trunk Rotation  - 1 x daily - 5 x weekly - 1 sets - 20 reps - Supine Sciatic Nerve Glide  - 1 x daily - 5 x weekly - 2 sets - 10 reps - Seated Hamstring Stretch  - 1 x daily - 5 x weekly - 1 sets - 2-3 reps - 45 seconds hold  ASSESSMENT:  CLINICAL IMPRESSION: Emphasis of skilled session on reviewing and solidifying HEP to support strength and mobility of joints around lumbar spine.  She has some mild tightness and discomfort in midline low back during exercises with resolution to baseline on cessation of tasks.  Her overall pain level is managed around a 1-2 based on today and evaluation and PT will further address pain management techniques next session.  Will continue per POC with goal of  assessing response to IASTM and gentle hip distraction for pain relief next session.  OBJECTIVE IMPAIRMENTS: decreased activity tolerance, improper body mechanics, postural dysfunction, and pain.   ACTIVITY LIMITATIONS: carrying, lifting, and bending  PARTICIPATION LIMITATIONS: driving, community activity, and occupation  PERSONAL FACTORS: Fitness, Time since onset of injury/illness/exacerbation, and 1 comorbidity: prior left knee surgery  are also affecting patient's functional outcome.   REHAB POTENTIAL: Good  CLINICAL DECISION MAKING: Evolving/moderate complexity  EVALUATION COMPLEXITY: Moderate   GOALS: Goals reviewed with patient? Yes  SHORT TERM GOALS: Target date: 05/25/2023  Pt will be independent and compliant with introductory strength and stretching HEP in order to maintain functional progress and improve mobility. Baseline:  Established on eval. Goal status: INITIAL  LONG TERM GOALS: Target date: 06/22/2023  Pt will be independent and compliant with advanced and finalized strength and stretching HEP in order to maintain functional progress and improve mobility. Baseline: Established on eval. Goal status: INITIAL  2.  Pt will improve modified oswestry score to </= 24% in order to demonstrate improved pain management and function. Baseline: 34% Goal status:  INITIAL  3.  Patient will maintain full left back rotation without pain in order to improve general mobility tolerance. Baseline: pain on left rotation w/ normal ROM Goal status: INITIAL  PLAN:  PT FREQUENCY: 1x/week  PT DURATION: 8 weeks  PLANNED INTERVENTIONS: 97164- PT Re-evaluation, 97110-Therapeutic exercises, 97530- Therapeutic activity, 97112- Neuromuscular re-education, 97535- Self Care, [id number]- Manual therapy, [id number]- Electrical stimulation (manual), Patient/Family education, Taping, Dry Needling, Joint mobilization, Spinal mobilization, Cryotherapy, and Moist heat.  PLAN FOR NEXT SESSION: add to HEP  for core and low back strengthening and stretching, hip joint distraction for pain relief, pain management techniques for home, midline low back IASTM, lumbar rollouts, open books   Sadie Haber, PT, DPT 05/01/2023, 3:35 PM

## 2023-05-01 NOTE — Patient Instructions (Signed)
 Access Code: [email or web address] URL: [email or web address] Date: 05/01/2023 Prepared by: Camille Bal  Exercises - Supine Bridge  - 1 x daily - 5 x weekly - 3 sets - 10 reps - Clamshell  - 1 x daily - 5 x weekly - 3 sets - 10 reps - Supine Hip Adduction Isometric with Ball  - 1 x daily - 5 x weekly - 3 sets - 10 reps - 1-2 seconds hold - Pelvic Muscle Energy Technique With Flexion and Extension  - 1 x daily - 5 x weekly - 2 sets - 5 reps - 10 seconds hold - Supine 90/90 Alternating Toe Touch  - 1 x daily - 5 x weekly - 2 sets - 10 reps - Supine Lower Trunk Rotation  - 1 x daily - 5 x weekly - 1 sets - 20 reps - Supine Sciatic Nerve Glide  - 1 x daily - 5 x weekly - 2 sets - 10 reps - Seated Hamstring Stretch  - 1 x daily - 5 x weekly - 1 sets - 2-3 reps - 45 seconds hold

## 2023-05-08 ENCOUNTER — Encounter: Payer: Self-pay | Admitting: Physical Therapy

## 2023-05-08 ENCOUNTER — Ambulatory Visit: Admitting: Physical Therapy

## 2023-05-08 DIAGNOSIS — M25552 Pain in left hip: Secondary | ICD-10-CM

## 2023-05-08 DIAGNOSIS — M5459 Other low back pain: Secondary | ICD-10-CM

## 2023-05-08 DIAGNOSIS — R29898 Other symptoms and signs involving the musculoskeletal system: Secondary | ICD-10-CM

## 2023-05-08 NOTE — Patient Instructions (Signed)
 Access Code: [email or web address] URL: [email or web address] Date: 05/01/2023 Prepared by: Camille Bal  Exercises - Supine Bridge  - 1 x daily - 5 x weekly - 3 sets - 10 reps - Clamshell  - 1 x daily - 5 x weekly - 3 sets - 10 reps - Supine Hip Adduction Isometric with Ball  - 1 x daily - 5 x weekly - 3 sets - 10 reps - 1-2 seconds hold - Pelvic Muscle Energy Technique With Flexion and Extension  - 1 x daily - 5 x weekly - 2 sets - 5 reps - 10 seconds hold - Supine 90/90 Alternating Toe Touch  - 1 x daily - 5 x weekly - 2 sets - 10 reps - Supine Lower Trunk Rotation  - 1 x daily - 5 x weekly - 1 sets - 20 reps - Supine Sciatic Nerve Glide  - 1 x daily - 5 x weekly - 2 sets - 10 reps - Seated Hamstring Stretch  - 1 x daily - 5 x weekly - 1 sets - 2-3 reps - 45 seconds hold - Supine Posterior Pelvic Tilt  - 1 x daily - 5 x weekly - 2 sets - 10 reps - Seated Flexion Stretch  - 1 x daily - 5 x weekly - 1-2 sets - 10 reps

## 2023-05-08 NOTE — Therapy (Signed)
 OUTPATIENT PHYSICAL THERAPY THORACOLUMBAR TREATMENT   Patient Name: Maureen Lynch MRN: [id number] DOB:02-Dec-1985, 38 y.o., female Today's Date: 05/08/2023  END OF SESSION:  PT End of Session - 05/08/23 1407     Visit Number 3    Number of Visits 9   8+eval   Date for PT Re-Evaluation 06/29/23   pushed out due to possible cancellations   Authorization Type [employer]    PT Start Time 1404    PT Stop Time 1445    PT Time Calculation (min) 41 min    Activity Tolerance Patient tolerated treatment well    Behavior During Therapy [facility] for tasks assessed/performed             Past Medical History:  Diagnosis Date   Headache    History of marijuana use    last use- 10/2014   History of trichomonal vaginitis    treated   Hx of chlamydia infection    Vaginal Pap smear, abnormal    Past Surgical History:  Procedure Laterality Date   KNEE ARTHROCENTESIS     THERAPEUTIC ABORTION     TONSILLECTOMY AND ADENOIDECTOMY     Patient Active Problem List   Diagnosis Date Noted   Mild sleep apnea 08/22/2022   Loud snoring 06/05/2022   SVD (spontaneous vaginal delivery) 06/14/2015   Periurethral laceration, delivered, current hospitalization 06/14/2015   Obstetric vaginal laceration, delivered, current hospitalization 06/14/2015   Obstetric labial laceration, delivered, current hospitalization 06/14/2015   Normal labor 06/13/2015   Morbid obesity with BMI of 45.0-49.9, adult (HCC) 06/13/2015    PCP: Loney Laurence, NP  REFERRING PROVIDER: Loney Laurence, NP  REFERRING DIAG: M41.9 (ICD-10-CM) - Scoliosis, unspecified  Rationale for Evaluation and Treatment: Rehabilitation  THERAPY DIAG:  Other low back pain  Pain in left hip  Other symptoms and signs involving the musculoskeletal system  ONSET DATE: 2 years ago  SUBJECTIVE:                                                                                                                                                                                            SUBJECTIVE STATEMENT: Pt states she worked out yesterday, had a mild headache last night that resolved within 30 minutes to an hour.  She has taken note of the fact that she stands in a relaxed position during car duty (discussed in session prior - anterior pelvic tilt/resting on Y ligaments) and when she adjusts her posture she has some relief.  She had a near fall when walking with a child that jumped between her legs causing her to trip.  This  increased her back pain in a similar distribution to that of today.  She feels her pain today is making her limp slightly.  PERTINENT HISTORY:  Vaginal delivery, mild sleep apnea, left MCL, ACL, and medial meniscal repair at 38 years old  PAIN:  Are you having pain? Yes: NPRS scale: 5 Pain location: low back and left groin Pain description: achy Aggravating factors: prolonged positions Relieving factors: switching positions  PRECAUTIONS: None  RED FLAGS: None   WEIGHT BEARING RESTRICTIONS: No  FALLS:  Has patient fallen in last 6 months? No  LIVING ENVIRONMENT: Lives with: lives with their son - 93 years old Lives in: House/apartment Stairs: Yes: External: 3 steps; none Has following equipment at home: Ramped entry  OCCUPATION: Elementary school teacher/childcare provider  PLOF: Independent  PATIENT GOALS: "pain relief"  NEXT MD VISIT: none  OBJECTIVE:  Note: Objective measures were completed at Evaluation unless otherwise noted.  DIAGNOSTIC FINDINGS:  Lumbar x-ray 06/18/2022: IMPRESSION: 1. Mild degenerative disc disease at L3-L4. 2. Broad-based dextroscoliotic curvature.  PATIENT SURVEYS:  Modified Oswestry 17/50 = 34% moderate perceived disability   COGNITION: Overall cognitive status: Within functional limits for tasks assessed     SENSATION: Light touch: WFL  POSTURE: rounded shoulders, forward head, and pt alternating pelvic weight shift frequently throughout  evaluation from left to right  PALPATION: TTP at L3-L5 SP, more tender over lateral left hip joint, tender in soft tissues over bilateral iliac crest, soreness w/ palpation in medial adductor region mostly on right  LUMBAR ROM:   AROM eval  Flexion 30  Extension 11  Right lateral flexion WNL  Left lateral flexion "  Right rotation "  Left rotation "; Pain in left back and hip   (Blank rows = not tested)  LOWER EXTREMITY ROM:     Active  Right eval Left eval  Hip flexion WNL  Hip extension   Hip abduction   Hip adduction   Hip internal rotation   Hip external rotation   Knee flexion   Knee extension   Ankle dorsiflexion   Ankle plantarflexion    Ankle inversion    Ankle eversion     (Blank rows = not tested)  LOWER EXTREMITY MMT:    MMT Right eval Left eval  Hip flexion Grossly 4+/5  Hip extension   Hip abduction   Hip adduction   Hip internal rotation   Hip external rotation   Knee flexion   Knee extension   Ankle dorsiflexion   Ankle plantarflexion    Ankle inversion    Ankle eversion     (Blank rows = not tested)  LUMBAR SPECIAL TESTS:  SI Compression/distraction test: Negative, FABER test: + bilaterally, more intense in lumbar spine on left side, and Trendelenburg sign: + and compensated bilaterally  FUNCTIONAL TESTS:  5 times sit to stand: 13.37 sec no UE support  GAIT: Distance walked: various clinic distances Assistive device utilized: None Level of assistance: Complete Independence Comments: No LOB or obvious instability or antalgic gait.  TREATMENT DATE: 05/08/2023                                                                                                                              -  IASTM/STM/myofascial release to left hip and low back, pt reports some relief over time, did not rate pain  -Short arc prone kickbacks x10 each LE > long arc prone hip extension x10 each LE -Prone kickback w/ lower trunk rotation x10 each LE -Prone  pressup on elbows to full extension x10 -Supine left LE/hip manual distraction for pain relief 5x10-15 seconds using variable degrees of hip abduction to roughly 50 degrees w/ pt having mild relief at farthest range -Supine PPT x20 w/ 2 sec hold, return demo and cues to continue diaphragmatic breathing -Seated forward fold x10 w/ paced breathing  PATIENT EDUCATION:  Education details:  Continue HEP w/ additions and walk a little everyday.  Can add cobra stretch as pt inquires about this and was encouraged to gently and slowly return to any enjoyed exercise and grade performance based on tolerance (decrease ROM w/ pain, decrease weight/reps if excessive fatigue or pain, etc) Person educated: Patient Education method: Explanation, Demonstration, Tactile cues, Verbal cues, and Handouts Education comprehension: verbalized understanding  HOME EXERCISE PROGRAM: Access Code: [email or web address] URL: [email or web address] Date: 05/01/2023 Prepared by: Camille Bal  Exercises - Supine Bridge  - 1 x daily - 5 x weekly - 3 sets - 10 reps - Clamshell  - 1 x daily - 5 x weekly - 3 sets - 10 reps - Supine Hip Adduction Isometric with Ball  - 1 x daily - 5 x weekly - 3 sets - 10 reps - 1-2 seconds hold - Pelvic Muscle Energy Technique With Flexion and Extension  - 1 x daily - 5 x weekly - 2 sets - 5 reps - 10 seconds hold - Supine 90/90 Alternating Toe Touch  - 1 x daily - 5 x weekly - 2 sets - 10 reps - Supine Lower Trunk Rotation  - 1 x daily - 5 x weekly - 1 sets - 20 reps - Supine Sciatic Nerve Glide  - 1 x daily - 5 x weekly - 2 sets - 10 reps - Seated Hamstring Stretch  - 1 x daily - 5 x weekly - 1 sets - 2-3 reps - 45 seconds hold - Supine Posterior Pelvic Tilt  - 1 x daily - 5 x weekly - 2 sets - 10 reps - Seated Flexion Stretch  - 1 x daily - 5 x weekly - 1-2 sets - 10 reps  ASSESSMENT:  CLINICAL IMPRESSION: Made additions to HEP this session to continue addressing pelvic and lower  lumbar mobility as postural correction seem to have positive pain effects.  Patient has neutral response to manual distraction of hip joint, but positive response to IASTM and other soft tissue techniques.  She remains active and motivated.  PT to continue to address mobility limitations and pain management per ongoing POC.  OBJECTIVE IMPAIRMENTS: decreased activity tolerance, improper body mechanics, postural dysfunction, and pain.   ACTIVITY LIMITATIONS: carrying, lifting, and bending  PARTICIPATION LIMITATIONS: driving, community activity, and occupation  PERSONAL FACTORS: Fitness, Time since onset of injury/illness/exacerbation, and 1 comorbidity: prior left knee surgery  are also affecting patient's functional outcome.   REHAB POTENTIAL: Good  CLINICAL DECISION MAKING: Evolving/moderate complexity  EVALUATION COMPLEXITY: Moderate   GOALS: Goals reviewed with patient? Yes  SHORT TERM GOALS: Target date: 05/25/2023  Pt will be independent and compliant with introductory strength and stretching HEP in order to maintain functional progress and improve mobility. Baseline:  Established on eval. Goal status: INITIAL  LONG TERM GOALS: Target date: 06/22/2023  Pt will be independent and  compliant with advanced and finalized strength and stretching HEP in order to maintain functional progress and improve mobility. Baseline: Established on eval. Goal status: INITIAL  2.  Pt will improve modified oswestry score to </= 24% in order to demonstrate improved pain management and function. Baseline: 34% Goal status: INITIAL  3.  Patient will maintain full left back rotation without pain in order to improve general mobility tolerance. Baseline: pain on left rotation w/ normal ROM Goal status: INITIAL  PLAN:  PT FREQUENCY: 1x/week  PT DURATION: 8 weeks  PLANNED INTERVENTIONS: 97164- PT Re-evaluation, 97110-Therapeutic exercises, 97530- Therapeutic activity, 97112- Neuromuscular  re-education, 97535- Self Care, [id number]- Manual therapy, [id number]- Electrical stimulation (manual), Patient/Family education, Taping, Dry Needling, Joint mobilization, Spinal mobilization, Cryotherapy, and Moist heat.  PLAN FOR NEXT SESSION: add to HEP for core and low back strengthening and stretching, pain management techniques for home - TENS?, may repeat midline low back IASTM prn, lumbar rollouts, open books, supine core progression, slam balls, windmills   Sadie Haber, PT, DPT 05/08/2023, 2:55 PM

## 2023-05-15 ENCOUNTER — Encounter: Payer: Self-pay | Admitting: Physical Therapy

## 2023-05-15 ENCOUNTER — Ambulatory Visit: Admitting: Physical Therapy

## 2023-05-15 DIAGNOSIS — R29898 Other symptoms and signs involving the musculoskeletal system: Secondary | ICD-10-CM

## 2023-05-15 DIAGNOSIS — M5459 Other low back pain: Secondary | ICD-10-CM

## 2023-05-15 DIAGNOSIS — M25552 Pain in left hip: Secondary | ICD-10-CM

## 2023-05-15 NOTE — Therapy (Signed)
 OUTPATIENT PHYSICAL THERAPY THORACOLUMBAR TREATMENT   Patient Name: Maureen Lynch MRN: [id number] DOB:12/30/1985, 38 y.o., female Today's Date: 05/15/2023  END OF SESSION:  PT End of Session - 05/15/23 1411     Visit Number 4    Number of Visits 9   8+eval   Date for PT Re-Evaluation 06/29/23   pushed out due to possible cancellations   Authorization Type [employer]    PT Start Time [phone]    PT Stop Time 1447    PT Time Calculation (min) 42 min    Activity Tolerance Patient tolerated treatment well    Behavior During Therapy [facility] for tasks assessed/performed             Past Medical History:  Diagnosis Date   Headache    History of marijuana use    last use- 10/2014   History of trichomonal vaginitis    treated   Hx of chlamydia infection    Vaginal Pap smear, abnormal    Past Surgical History:  Procedure Laterality Date   KNEE ARTHROCENTESIS     THERAPEUTIC ABORTION     TONSILLECTOMY AND ADENOIDECTOMY     Patient Active Problem List   Diagnosis Date Noted   Mild sleep apnea 08/22/2022   Loud snoring 06/05/2022   SVD (spontaneous vaginal delivery) 06/14/2015   Periurethral laceration, delivered, current hospitalization 06/14/2015   Obstetric vaginal laceration, delivered, current hospitalization 06/14/2015   Obstetric labial laceration, delivered, current hospitalization 06/14/2015   Normal labor 06/13/2015   Morbid obesity with BMI of 45.0-49.9, adult (HCC) 06/13/2015    PCP: Loney Laurence, NP  REFERRING PROVIDER: Loney Laurence, NP  REFERRING DIAG: M41.9 (ICD-10-CM) - Scoliosis, unspecified  Rationale for Evaluation and Treatment: Rehabilitation  THERAPY DIAG:  Other low back pain  Pain in left hip  Other symptoms and signs involving the musculoskeletal system  ONSET DATE: 2 years ago  SUBJECTIVE:                                                                                                                                                                                            SUBJECTIVE STATEMENT: She reports decreased activity today and her neck was bothering her this past Sunday and she has ongoing tension in the neck and low back.  PERTINENT HISTORY:  Vaginal delivery, mild sleep apnea, left MCL, ACL, and medial meniscal repair at 38 years old  PAIN:  Are you having pain? Yes: NPRS scale: 2-3 Pain location: low back and left groin Pain description: achy Aggravating factors: prolonged positions Relieving factors: switching positions  PRECAUTIONS: None  RED FLAGS: None  WEIGHT BEARING RESTRICTIONS: No  FALLS:  Has patient fallen in last 6 months? No  LIVING ENVIRONMENT: Lives with: lives with their son - 13 years old Lives in: House/apartment Stairs: Yes: External: 3 steps; none Has following equipment at home: Ramped entry  OCCUPATION: Elementary school teacher/childcare provider  PLOF: Independent  PATIENT GOALS: "pain relief"  NEXT MD VISIT: none  OBJECTIVE:  Note: Objective measures were completed at Evaluation unless otherwise noted.  DIAGNOSTIC FINDINGS:  Lumbar x-ray 06/18/2022: IMPRESSION: 1. Mild degenerative disc disease at L3-L4. 2. Broad-based dextroscoliotic curvature.  PATIENT SURVEYS:  Modified Oswestry 17/50 = 34% moderate perceived disability   COGNITION: Overall cognitive status: Within functional limits for tasks assessed     SENSATION: Light touch: WFL  POSTURE: rounded shoulders, forward head, and pt alternating pelvic weight shift frequently throughout evaluation from left to right  PALPATION: TTP at L3-L5 SP, more tender over lateral left hip joint, tender in soft tissues over bilateral iliac crest, soreness w/ palpation in medial adductor region mostly on right  LUMBAR ROM:   AROM eval  Flexion 30  Extension 11  Right lateral flexion WNL  Left lateral flexion "  Right rotation "  Left rotation "; Pain in left back and hip   (Blank rows = not  tested)  LOWER EXTREMITY ROM:     Active  Right eval Left eval  Hip flexion WNL  Hip extension   Hip abduction   Hip adduction   Hip internal rotation   Hip external rotation   Knee flexion   Knee extension   Ankle dorsiflexion   Ankle plantarflexion    Ankle inversion    Ankle eversion     (Blank rows = not tested)  LOWER EXTREMITY MMT:    MMT Right eval Left eval  Hip flexion Grossly 4+/5  Hip extension   Hip abduction   Hip adduction   Hip internal rotation   Hip external rotation   Knee flexion   Knee extension   Ankle dorsiflexion   Ankle plantarflexion    Ankle inversion    Ankle eversion     (Blank rows = not tested)  LUMBAR SPECIAL TESTS:  SI Compression/distraction test: Negative, FABER test: + bilaterally, more intense in lumbar spine on left side, and Trendelenburg sign: + and compensated bilaterally  FUNCTIONAL TESTS:  5 times sit to stand: 13.37 sec no UE support  GAIT: Distance walked: various clinic distances Assistive device utilized: None Level of assistance: Complete Independence Comments: No LOB or obvious instability or antalgic gait.  TREATMENT DATE: 05/15/2023                                                                                                                              -TENS setup on patient using back preset for initial 20 minutes of session on low back and left cervical region during mat level therex for pain management - cervical channel 16-18 intensity, lumbar channel 19  intensity (remainder of session completed without TENS after skin assessed and WNL following electrode removal), pt reports same pain following completion of TENS use:  -modified thomas stretch x2 minutes each LE  -90-90 LE hold w/ bilateral 3lb chest press x15  -Bilateral heel taps to mat x10 > lateral taps lifting over 4" target x10 each side  -Supine marching w/ green theraband at knees x20  -PPT x15  -LTRs x10 each side > ER/IR stretch x10 each side  w/ 3 second hold each  PATIENT EDUCATION:  Education details:  Continue HEP w/ additions and walk a little everyday.  Purpose of TENS and potential OOP purchase if pt found this helpful - she will think about it, instructed to bring to PT session if she purchases for education on home use. Person educated: Patient Education method: Explanation, Demonstration, Tactile cues, Verbal cues, and Handouts Education comprehension: verbalized understanding  HOME EXERCISE PROGRAM: Access Code: [email or web address] URL: [email or web address] Date: 05/01/2023 Prepared by: Camille Bal  Exercises - Supine Bridge  - 1 x daily - 5 x weekly - 3 sets - 10 reps - Clamshell  - 1 x daily - 5 x weekly - 3 sets - 10 reps - Supine Hip Adduction Isometric with Ball  - 1 x daily - 5 x weekly - 3 sets - 10 reps - 1-2 seconds hold - Pelvic Muscle Energy Technique With Flexion and Extension  - 1 x daily - 5 x weekly - 2 sets - 5 reps - 10 seconds hold - Supine 90/90 Alternating Toe Touch  - 1 x daily - 5 x weekly - 2 sets - 10 reps - Supine Lower Trunk Rotation  - 1 x daily - 5 x weekly - 1 sets - 20 reps - Supine Sciatic Nerve Glide  - 1 x daily - 5 x weekly - 2 sets - 10 reps - Seated Hamstring Stretch  - 1 x daily - 5 x weekly - 1 sets - 2-3 reps - 45 seconds hold - Supine Posterior Pelvic Tilt  - 1 x daily - 5 x weekly - 2 sets - 10 reps - Seated Flexion Stretch  - 1 x daily - 5 x weekly - 1-2 sets - 10 reps  ASSESSMENT:  CLINICAL IMPRESSION: Focus of skilled session on addressing ongoing back pain and new onset cervical discomfort both located primarily on the left side.  She had neutral response to TENS completed with therapeutic exercise today.  Will only revisit this modality if pt returns with positive pain management report with interest in pursuing individual purchase for home management.  PT planning to continue functional strengthening and stretching for improved general mobility.  Continue per  POC.  OBJECTIVE IMPAIRMENTS: decreased activity tolerance, improper body mechanics, postural dysfunction, and pain.   ACTIVITY LIMITATIONS: carrying, lifting, and bending  PARTICIPATION LIMITATIONS: driving, community activity, and occupation  PERSONAL FACTORS: Fitness, Time since onset of injury/illness/exacerbation, and 1 comorbidity: prior left knee surgery  are also affecting patient's functional outcome.   REHAB POTENTIAL: Good  CLINICAL DECISION MAKING: Evolving/moderate complexity  EVALUATION COMPLEXITY: Moderate   GOALS: Goals reviewed with patient? Yes  SHORT TERM GOALS: Target date: 05/25/2023  Pt will be independent and compliant with introductory strength and stretching HEP in order to maintain functional progress and improve mobility. Baseline:  Established on eval. Goal status: INITIAL  LONG TERM GOALS: Target date: 06/22/2023  Pt will be independent and compliant with advanced and finalized strength and stretching HEP in order to maintain  functional progress and improve mobility. Baseline: Established on eval. Goal status: INITIAL  2.  Pt will improve modified oswestry score to </= 24% in order to demonstrate improved pain management and function. Baseline: 34% Goal status: INITIAL  3.  Patient will maintain full left back rotation without pain in order to improve general mobility tolerance. Baseline: pain on left rotation w/ normal ROM Goal status: INITIAL  PLAN:  PT FREQUENCY: 1x/week  PT DURATION: 8 weeks  PLANNED INTERVENTIONS: 97164- PT Re-evaluation, 97110-Therapeutic exercises, 97530- Therapeutic activity, 97112- Neuromuscular re-education, 97535- Self Care, 16109- Manual therapy, [id number]- Electrical stimulation (manual), Patient/Family education, Taping, Dry Needling, Joint mobilization, Spinal mobilization, Cryotherapy, and Moist heat.  PLAN FOR NEXT SESSION: add to HEP for core and low back strengthening and stretching, pain management techniques for  home, may repeat midline low back IASTM prn, lumbar rollouts, open books, supine core progression, slam balls, windmills, update STG   Sadie Haber, PT, DPT 05/15/2023, 2:55 PM

## 2023-05-22 ENCOUNTER — Ambulatory Visit: Attending: Family Medicine | Admitting: Physical Therapy

## 2023-05-22 ENCOUNTER — Encounter: Payer: Self-pay | Admitting: Physical Therapy

## 2023-05-22 DIAGNOSIS — M25552 Pain in left hip: Secondary | ICD-10-CM | POA: Diagnosis present

## 2023-05-22 DIAGNOSIS — M5459 Other low back pain: Secondary | ICD-10-CM | POA: Diagnosis present

## 2023-05-22 DIAGNOSIS — R29898 Other symptoms and signs involving the musculoskeletal system: Secondary | ICD-10-CM | POA: Diagnosis present

## 2023-05-22 NOTE — Patient Instructions (Addendum)
 Access Code: [email or web address] URL: [email or web address] Date: 05/01/2023 Prepared by: Camille Bal  Exercises - Supine Bridge  - 1 x daily - 5 x weekly - 3 sets - 10 reps - Clamshell  - 1 x daily - 5 x weekly - 3 sets - 10 reps - Supine Hip Adduction Isometric with Ball  - 1 x daily - 5 x weekly - 3 sets - 10 reps - 1-2 seconds hold - Pelvic Muscle Energy Technique With Flexion and Extension  - 1 x daily - 5 x weekly - 2 sets - 5 reps - 10 seconds hold - Supine 90/90 Alternating Toe Touch  - 1 x daily - 5 x weekly - 2 sets - 10 reps - Supine Lower Trunk Rotation  - 1 x daily - 5 x weekly - 1 sets - 20 reps - Supine Sciatic Nerve Glide  - 1 x daily - 5 x weekly - 2 sets - 10 reps - Seated Hamstring Stretch  - 1 x daily - 5 x weekly - 1 sets - 2-3 reps - 45 seconds hold - Supine Posterior Pelvic Tilt  - 1 x daily - 5 x weekly - 2 sets - 10 reps - Seated Flexion Stretch  - 1 x daily - 5 x weekly - 1-2 sets - 10 reps - Thomas Stretch on Table  - 1 x daily - 5 x weekly - 1 sets - 2-3 reps - 45-60 seconds hold - Sidelying ITB Stretch  - 1 x daily - 5 x weekly - 1 sets - 2-3 reps - 30-45 seconds hold

## 2023-05-22 NOTE — Therapy (Signed)
 OUTPATIENT PHYSICAL THERAPY THORACOLUMBAR TREATMENT   Patient Name: Reilyn Nelson MRN: [id number] DOB:11-16-1985, 38 y.o., female Today's Date: 05/22/2023  END OF SESSION:  PT End of Session - 05/22/23 1409     Visit Number 5    Number of Visits 9   8+eval   Date for PT Re-Evaluation 06/29/23   pushed out due to possible cancellations   Authorization Type [employer]    PT Start Time [phone]   pt arrived late to PT   PT Stop Time 1455    PT Time Calculation (min) 50 min    Activity Tolerance Patient tolerated treatment well    Behavior During Therapy [facility] for tasks assessed/performed             Past Medical History:  Diagnosis Date   Headache    History of marijuana use    last use- 10/2014   History of trichomonal vaginitis    treated   Hx of chlamydia infection    Vaginal Pap smear, abnormal    Past Surgical History:  Procedure Laterality Date   KNEE ARTHROCENTESIS     THERAPEUTIC ABORTION     TONSILLECTOMY AND ADENOIDECTOMY     Patient Active Problem List   Diagnosis Date Noted   Mild sleep apnea 08/22/2022   Loud snoring 06/05/2022   SVD (spontaneous vaginal delivery) 06/14/2015   Periurethral laceration, delivered, current hospitalization 06/14/2015   Obstetric vaginal laceration, delivered, current hospitalization 06/14/2015   Obstetric labial laceration, delivered, current hospitalization 06/14/2015   Normal labor 06/13/2015   Morbid obesity with BMI of 45.0-49.9, adult (HCC) 06/13/2015    PCP: Loney Laurence, NP  REFERRING PROVIDER: Loney Laurence, NP  REFERRING DIAG: M41.9 (ICD-10-CM) - Scoliosis, unspecified  Rationale for Evaluation and Treatment: Rehabilitation  THERAPY DIAG:  Other low back pain  Pain in left hip  Other symptoms and signs involving the musculoskeletal system  ONSET DATE: 2 years ago  SUBJECTIVE:                                                                                                                                                                                            SUBJECTIVE STATEMENT: She reports her mid-back felt like she had a pinched nerve last week but this has since improved.  She denies activity change or recent fall/injury.  Her low back pain is better today, but that left area is still bothersome.  PERTINENT HISTORY:  Vaginal delivery, mild sleep apnea, left MCL, ACL, and medial meniscal repair at 38 years old  PAIN:  Are you having pain? Yes: NPRS scale: 2-3 Pain location: left  groin Pain description: achy Aggravating factors: prolonged positions Relieving factors: switching positions  PRECAUTIONS: None  RED FLAGS: None   WEIGHT BEARING RESTRICTIONS: No  FALLS:  Has patient fallen in last 6 months? No  LIVING ENVIRONMENT: Lives with: lives with their son - 102 years old Lives in: House/apartment Stairs: Yes: External: 3 steps; none Has following equipment at home: Ramped entry  OCCUPATION: Elementary school teacher/childcare provider  PLOF: Independent  PATIENT GOALS: "pain relief"  NEXT MD VISIT: none  OBJECTIVE:  Note: Objective measures were completed at Evaluation unless otherwise noted.  DIAGNOSTIC FINDINGS:  Lumbar x-ray 06/18/2022: IMPRESSION: 1. Mild degenerative disc disease at L3-L4. 2. Broad-based dextroscoliotic curvature.  PATIENT SURVEYS:  Modified Oswestry 17/50 = 34% moderate perceived disability   COGNITION: Overall cognitive status: Within functional limits for tasks assessed     SENSATION: Light touch: WFL  POSTURE: rounded shoulders, forward head, and pt alternating pelvic weight shift frequently throughout evaluation from left to right  PALPATION: TTP at L3-L5 SP, more tender over lateral left hip joint, tender in soft tissues over bilateral iliac crest, soreness w/ palpation in medial adductor region mostly on right  LUMBAR ROM:   AROM eval  Flexion 30  Extension 11  Right lateral flexion WNL  Left lateral  flexion "  Right rotation "  Left rotation "; Pain in left back and hip   (Blank rows = not tested)  LOWER EXTREMITY ROM:     Active  Right eval Left eval  Hip flexion WNL  Hip extension   Hip abduction   Hip adduction   Hip internal rotation   Hip external rotation   Knee flexion   Knee extension   Ankle dorsiflexion   Ankle plantarflexion    Ankle inversion    Ankle eversion     (Blank rows = not tested)  LOWER EXTREMITY MMT:    MMT Right eval Left eval  Hip flexion Grossly 4+/5  Hip extension   Hip abduction   Hip adduction   Hip internal rotation   Hip external rotation   Knee flexion   Knee extension   Ankle dorsiflexion   Ankle plantarflexion    Ankle inversion    Ankle eversion     (Blank rows = not tested)  LUMBAR SPECIAL TESTS:  SI Compression/distraction test: Negative, FABER test: + bilaterally, more intense in lumbar spine on left side, and Trendelenburg sign: + and compensated bilaterally  FUNCTIONAL TESTS:  5 times sit to stand: 13.37 sec no UE support  GAIT: Distance walked: various clinic distances Assistive device utilized: None Level of assistance: Complete Independence Comments: No LOB or obvious instability or antalgic gait.  TREATMENT DATE: 05/21/2023                                                                                                                              -Lumbar rollouts x20 x3-directions for low back and lateral stretching -Seated windmills 2x10, edu  on core and upper back engagement to improve upright posture and coordination -Standing ITB stretch - unable to obtain position of adequate stretch so modified to side-lying 3x30 sec each side -Side-lying thoracic open books x15 each side -Sahrmann progression 3/4 x20 > x10  PATIENT EDUCATION:  Education details:  Additions to HEP. Person educated: Patient Education method: Explanation, Demonstration, Tactile cues, Verbal cues, and Handouts Education comprehension:  verbalized understanding  HOME EXERCISE PROGRAM: Access Code: [email or web address] URL: [email or web address] Date: 05/01/2023 Prepared by: Camille Bal  Exercises - Supine Bridge  - 1 x daily - 5 x weekly - 3 sets - 10 reps - Clamshell  - 1 x daily - 5 x weekly - 3 sets - 10 reps - Supine Hip Adduction Isometric with Ball  - 1 x daily - 5 x weekly - 3 sets - 10 reps - 1-2 seconds hold - Pelvic Muscle Energy Technique With Flexion and Extension  - 1 x daily - 5 x weekly - 2 sets - 5 reps - 10 seconds hold - Supine 90/90 Alternating Toe Touch  - 1 x daily - 5 x weekly - 2 sets - 10 reps - Supine Lower Trunk Rotation  - 1 x daily - 5 x weekly - 1 sets - 20 reps - Supine Sciatic Nerve Glide  - 1 x daily - 5 x weekly - 2 sets - 10 reps - Seated Hamstring Stretch  - 1 x daily - 5 x weekly - 1 sets - 2-3 reps - 45 seconds hold - Supine Posterior Pelvic Tilt  - 1 x daily - 5 x weekly - 2 sets - 10 reps - Seated Flexion Stretch  - 1 x daily - 5 x weekly - 1-2 sets - 10 reps - Thomas Stretch on Table  - 1 x daily - 5 x weekly - 1 sets - 2-3 reps - 45-60 seconds hold - Sidelying ITB Stretch  - 1 x daily - 5 x weekly - 1 sets - 2-3 reps - 30-45 seconds hold  ASSESSMENT:  CLINICAL IMPRESSION: PT continues to focus on spinal mobility and core control to improve muscular coordination and secondarily offset back and hip pain.  She had most relief with IT band stretch done today and added to HEP.  PT will continue with high level strength progression and pain management techniques as needed.  OBJECTIVE IMPAIRMENTS: decreased activity tolerance, improper body mechanics, postural dysfunction, and pain.   ACTIVITY LIMITATIONS: carrying, lifting, and bending  PARTICIPATION LIMITATIONS: driving, community activity, and occupation  PERSONAL FACTORS: Fitness, Time since onset of injury/illness/exacerbation, and 1 comorbidity: prior left knee surgery  are also affecting patient's functional outcome.    REHAB POTENTIAL: Good  CLINICAL DECISION MAKING: Evolving/moderate complexity  EVALUATION COMPLEXITY: Moderate   GOALS: Goals reviewed with patient? Yes  SHORT TERM GOALS: Target date: 05/25/2023  Pt will be independent and compliant with introductory strength and stretching HEP in order to maintain functional progress and improve mobility. Baseline:  Addition made 4/1 - pt is doing more than HEP (4/1) Goal status: MET  LONG TERM GOALS: Target date: 06/22/2023  Pt will be independent and compliant with advanced and finalized strength and stretching HEP in order to maintain functional progress and improve mobility. Baseline: Established on eval. Goal status: INITIAL  2.  Pt will improve modified oswestry score to </= 24% in order to demonstrate improved pain management and function. Baseline: 34% Goal status: INITIAL  3.  Patient will maintain full left back rotation without  pain in order to improve general mobility tolerance. Baseline: pain on left rotation w/ normal ROM Goal status: INITIAL  PLAN:  PT FREQUENCY: 1x/week  PT DURATION: 8 weeks  PLANNED INTERVENTIONS: 97164- PT Re-evaluation, 97110-Therapeutic exercises, 97530- Therapeutic activity, 97112- Neuromuscular re-education, 97535- Self Care, 16109- Manual therapy, [id number]- Electrical stimulation (manual), Patient/Family education, Taping, Dry Needling, Joint mobilization, Spinal mobilization, Cryotherapy, and Moist heat.  PLAN FOR NEXT SESSION: add to HEP for core and low back strengthening and stretching, pain management techniques for home, may repeat midline low back IASTM prn, supine core progression, slam balls, birddog progression, thread the needle > add weighted ball?   Sadie Haber, PT, DPT 05/22/2023, 3:43 PM

## 2023-05-29 ENCOUNTER — Ambulatory Visit: Admitting: Physical Therapy

## 2023-05-29 ENCOUNTER — Encounter: Payer: Self-pay | Admitting: Physical Therapy

## 2023-05-29 DIAGNOSIS — R29898 Other symptoms and signs involving the musculoskeletal system: Secondary | ICD-10-CM

## 2023-05-29 DIAGNOSIS — M5459 Other low back pain: Secondary | ICD-10-CM

## 2023-05-29 DIAGNOSIS — M25552 Pain in left hip: Secondary | ICD-10-CM

## 2023-05-29 NOTE — Therapy (Signed)
 OUTPATIENT PHYSICAL THERAPY THORACOLUMBAR TREATMENT   Patient Name: Maureen Lynch MRN: [id number] DOB:1985/02/26, 38 y.o., female Today's Date: 05/29/2023  END OF SESSION:  PT End of Session - 05/29/23 1408     Visit Number 6    Number of Visits 9   8+eval   Date for PT Re-Evaluation 06/29/23   pushed out due to possible cancellations   Authorization Type [employer]    PT Start Time 1404    PT Stop Time 1443    PT Time Calculation (min) 39 min    Activity Tolerance Patient tolerated treatment well    Behavior During Therapy [facility] for tasks assessed/performed             Past Medical History:  Diagnosis Date   Headache    History of marijuana use    last use- 10/2014   History of trichomonal vaginitis    treated   Hx of chlamydia infection    Vaginal Pap smear, abnormal    Past Surgical History:  Procedure Laterality Date   KNEE ARTHROCENTESIS     THERAPEUTIC ABORTION     TONSILLECTOMY AND ADENOIDECTOMY     Patient Active Problem List   Diagnosis Date Noted   Mild sleep apnea 08/22/2022   Loud snoring 06/05/2022   SVD (spontaneous vaginal delivery) 06/14/2015   Periurethral laceration, delivered, current hospitalization 06/14/2015   Obstetric vaginal laceration, delivered, current hospitalization 06/14/2015   Obstetric labial laceration, delivered, current hospitalization 06/14/2015   Normal labor 06/13/2015   Morbid obesity with BMI of 45.0-49.9, adult (HCC) 06/13/2015    PCP: Loney Laurence, NP  REFERRING PROVIDER: Loney Laurence, NP  REFERRING DIAG: M41.9 (ICD-10-CM) - Scoliosis, unspecified  Rationale for Evaluation and Treatment: Rehabilitation  THERAPY DIAG:  Other low back pain  Pain in left hip  Other symptoms and signs involving the musculoskeletal system  ONSET DATE: 2 years ago  SUBJECTIVE:                                                                                                                                                                                            SUBJECTIVE STATEMENT: She reports she has no pain at current, but had some about an hour ago when she was pulling nap mats off the floor.    PERTINENT HISTORY:  Vaginal delivery, mild sleep apnea, left MCL, ACL, and medial meniscal repair at 38 years old  PAIN:  Are you having pain? No  PRECAUTIONS: None  RED FLAGS: None   WEIGHT BEARING RESTRICTIONS: No  FALLS:  Has patient fallen in last 6 months? No  LIVING ENVIRONMENT:  Lives with: lives with their son - 19 years old Lives in: House/apartment Stairs: Yes: External: 3 steps; none Has following equipment at home: Ramped entry  OCCUPATION: Elementary school teacher/childcare provider  PLOF: Independent  PATIENT GOALS: "pain relief"  NEXT MD VISIT: none  OBJECTIVE:  Note: Objective measures were completed at Evaluation unless otherwise noted.  DIAGNOSTIC FINDINGS:  Lumbar x-ray 06/18/2022: IMPRESSION: 1. Mild degenerative disc disease at L3-L4. 2. Broad-based dextroscoliotic curvature.  PATIENT SURVEYS:  Modified Oswestry 17/50 = 34% moderate perceived disability   COGNITION: Overall cognitive status: Within functional limits for tasks assessed     SENSATION: Light touch: WFL  POSTURE: rounded shoulders, forward head, and pt alternating pelvic weight shift frequently throughout evaluation from left to right  PALPATION: TTP at L3-L5 SP, more tender over lateral left hip joint, tender in soft tissues over bilateral iliac crest, soreness w/ palpation in medial adductor region mostly on right  LUMBAR ROM:   AROM eval  Flexion 30  Extension 11  Right lateral flexion WNL  Left lateral flexion "  Right rotation "  Left rotation "; Pain in left back and hip   (Blank rows = not tested)  LOWER EXTREMITY ROM:     Active  Right eval Left eval  Hip flexion WNL  Hip extension   Hip abduction   Hip adduction   Hip internal rotation   Hip external rotation    Knee flexion   Knee extension   Ankle dorsiflexion   Ankle plantarflexion    Ankle inversion    Ankle eversion     (Blank rows = not tested)  LOWER EXTREMITY MMT:    MMT Right eval Left eval  Hip flexion Grossly 4+/5  Hip extension   Hip abduction   Hip adduction   Hip internal rotation   Hip external rotation   Knee flexion   Knee extension   Ankle dorsiflexion   Ankle plantarflexion    Ankle inversion    Ankle eversion     (Blank rows = not tested)  LUMBAR SPECIAL TESTS:  SI Compression/distraction test: Negative, FABER test: + bilaterally, more intense in lumbar spine on left side, and Trendelenburg sign: + and compensated bilaterally  FUNCTIONAL TESTS:  5 times sit to stand: 13.37 sec no UE support  GAIT: Distance walked: various clinic distances Assistive device utilized: None Level of assistance: Complete Independence Comments: No LOB or obvious instability or antalgic gait.  TREATMENT DATE: 05/29/2023                                                                                                                              -Sahrmann progression 3/4 2x10 each progression; using LTRs during rest to improve tolerance to challenge -Deadbug w/ 5lb dumbbell 2x10 -Bridge on physioball 2x10  -LTRs on physioball x20 -10lb slam ball in standing w/ lift x10 -Birddog kickbacks only x14 alternating LE > full birddogs 2x6 w/ multimodal cuing to improve TrA  activation and reduce trunk rotation, pt has good ability to self-correct  PATIENT EDUCATION:  Education details:  Continue HEP.  Benefits of aerobic activity for chronic LBP like taking a daily walk.  Lifting technique to protect back and using pain management techniques when flared up due to repetitive motions like putting down and picking up naptime mats. Person educated: Patient Education method: Explanation, Demonstration, Tactile cues, Verbal cues, and Handouts Education comprehension: verbalized  understanding  HOME EXERCISE PROGRAM: Access Code: [email or web address] URL: [email or web address] Date: 05/01/2023 Prepared by: Camille Bal  Exercises - Supine Bridge  - 1 x daily - 5 x weekly - 3 sets - 10 reps - Clamshell  - 1 x daily - 5 x weekly - 3 sets - 10 reps - Supine Hip Adduction Isometric with Ball  - 1 x daily - 5 x weekly - 3 sets - 10 reps - 1-2 seconds hold - Pelvic Muscle Energy Technique With Flexion and Extension  - 1 x daily - 5 x weekly - 2 sets - 5 reps - 10 seconds hold - Supine 90/90 Alternating Toe Touch  - 1 x daily - 5 x weekly - 2 sets - 10 reps - Supine Lower Trunk Rotation  - 1 x daily - 5 x weekly - 1 sets - 20 reps - Supine Sciatic Nerve Glide  - 1 x daily - 5 x weekly - 2 sets - 10 reps - Seated Hamstring Stretch  - 1 x daily - 5 x weekly - 1 sets - 2-3 reps - 45 seconds hold - Supine Posterior Pelvic Tilt  - 1 x daily - 5 x weekly - 2 sets - 10 reps - Seated Flexion Stretch  - 1 x daily - 5 x weekly - 1-2 sets - 10 reps - Thomas Stretch on Table  - 1 x daily - 5 x weekly - 1 sets - 2-3 reps - 45-60 seconds hold - Sidelying ITB Stretch  - 1 x daily - 5 x weekly - 1 sets - 2-3 reps - 30-45 seconds hold  ASSESSMENT:  CLINICAL IMPRESSION: Ongoing core stability work this session with pt having improved pain today.  Repeated education on lifting technique as she does several bouts of various bending and lifting during the workday.  She tolerates standing progression of core without issues and would do well with progression to deeper stretching and core coordination style tasks in future sessions.  Will continue per POC.  OBJECTIVE IMPAIRMENTS: decreased activity tolerance, improper body mechanics, postural dysfunction, and pain.   ACTIVITY LIMITATIONS: carrying, lifting, and bending  PARTICIPATION LIMITATIONS: driving, community activity, and occupation  PERSONAL FACTORS: Fitness, Time since onset of injury/illness/exacerbation, and 1  comorbidity: prior left knee surgery  are also affecting patient's functional outcome.   REHAB POTENTIAL: Good  CLINICAL DECISION MAKING: Evolving/moderate complexity  EVALUATION COMPLEXITY: Moderate   GOALS: Goals reviewed with patient? Yes  SHORT TERM GOALS: Target date: 05/25/2023  Pt will be independent and compliant with introductory strength and stretching HEP in order to maintain functional progress and improve mobility. Baseline:  Addition made 4/1 - pt is doing more than HEP (4/1) Goal status: MET  LONG TERM GOALS: Target date: 06/22/2023  Pt will be independent and compliant with advanced and finalized strength and stretching HEP in order to maintain functional progress and improve mobility. Baseline: Established on eval. Goal status: INITIAL  2.  Pt will improve modified oswestry score to </= 24% in order to demonstrate improved pain management and function.  Baseline: 34% Goal status: INITIAL  3.  Patient will maintain full left back rotation without pain in order to improve general mobility tolerance. Baseline: pain on left rotation w/ normal ROM Goal status: INITIAL  PLAN:  PT FREQUENCY: 1x/week  PT DURATION: 8 weeks  PLANNED INTERVENTIONS: 97164- PT Re-evaluation, 97110-Therapeutic exercises, 97530- Therapeutic activity, 97112- Neuromuscular re-education, 97535- Self Care, 16109- Manual therapy, [id number]- Electrical stimulation (manual), Patient/Family education, Taping, Dry Needling, Joint mobilization, Spinal mobilization, Cryotherapy, and Moist heat.  PLAN FOR NEXT SESSION: add to HEP for core and low back strengthening and stretching, pain management techniques for home, may repeat midline low back IASTM prn, repeat slam balls and birddogs (add birddog to HEP), thread the needle > add weighted ball?  Tall kneel variations - D1/D2, downward dog and child's pose   Sadie Haber, PT, DPT 05/29/2023, 2:44 PM

## 2023-06-12 ENCOUNTER — Encounter: Payer: Self-pay | Admitting: Physical Therapy

## 2023-06-12 ENCOUNTER — Ambulatory Visit: Admitting: Physical Therapy

## 2023-06-12 DIAGNOSIS — M25552 Pain in left hip: Secondary | ICD-10-CM

## 2023-06-12 DIAGNOSIS — R29898 Other symptoms and signs involving the musculoskeletal system: Secondary | ICD-10-CM

## 2023-06-12 DIAGNOSIS — M5459 Other low back pain: Secondary | ICD-10-CM

## 2023-06-12 NOTE — Patient Instructions (Signed)
-   Bird Dog  - 1 x daily - 5 x weekly - 2 sets - 10 reps

## 2023-06-12 NOTE — Therapy (Signed)
 OUTPATIENT PHYSICAL THERAPY THORACOLUMBAR TREATMENT   Patient Name: Maureen Lynch MRN: [id number] DOB:May 03, 1985, 38 y.o., female Today's Date: 06/12/2023  END OF SESSION:  PT End of Session - 06/12/23 1409     Visit Number 7    Number of Visits 9   8+eval   Date for PT Re-Evaluation 06/29/23   pushed out due to possible cancellations   Authorization Type [facility]    PT Start Time [phone]    PT Stop Time 1448    PT Time Calculation (min) 43 min    Activity Tolerance Patient tolerated treatment well;Patient limited by pain    Behavior During Therapy [facility] for tasks assessed/performed             Past Medical History:  Diagnosis Date   Headache    History of marijuana use    last use- 10/2014   History of trichomonal vaginitis    treated   Hx of chlamydia infection    Vaginal Pap smear, abnormal    Past Surgical History:  Procedure Laterality Date   KNEE ARTHROCENTESIS     THERAPEUTIC ABORTION     TONSILLECTOMY AND ADENOIDECTOMY     Patient Active Problem List   Diagnosis Date Noted   Mild sleep apnea 08/22/2022   Loud snoring 06/05/2022   SVD (spontaneous vaginal delivery) 06/14/2015   Periurethral laceration, delivered, current hospitalization 06/14/2015   Obstetric vaginal laceration, delivered, current hospitalization 06/14/2015   Obstetric labial laceration, delivered, current hospitalization 06/14/2015   Normal labor 06/13/2015   Morbid obesity with BMI of 45.0-49.9, adult (HCC) 06/13/2015    PCP: Duwayne Ginsberg, NP  REFERRING PROVIDER: Duwayne Ginsberg, NP  REFERRING DIAG: M41.9 (ICD-10-CM) - Scoliosis, unspecified  Rationale for Evaluation and Treatment: Rehabilitation  THERAPY DIAG:  Other low back pain  Pain in left hip  Other symptoms and signs involving the musculoskeletal system  ONSET DATE: 2 years ago  SUBJECTIVE:                                                                                                                                                                                            SUBJECTIVE STATEMENT: She reports she has had a recent flair of pain in the left groin and hip.  She has had no relief in the left (approximately) PSIS area despite intermittent relief with the low back and groin pain with PT.  She walked a longer walk as she has been aiming for 3 miles daily and this really impacted her groin pain yesterday.   PERTINENT HISTORY:  Vaginal delivery, mild sleep apnea, left MCL, ACL, and medial  meniscal repair at 38 years old  PAIN:  Are you having pain? Yes: NPRS scale: 5-6 Pain location: left groin, hip into low back Pain description: deep ache at rest, stabbing when walking Aggravating factors: walking Relieving factors: rest, difficult to find positions of comfort  PRECAUTIONS: None  RED FLAGS: None   WEIGHT BEARING RESTRICTIONS: No  FALLS:  Has patient fallen in last 6 months? No  LIVING ENVIRONMENT: Lives with: lives with their son - 41 years old Lives in: House/apartment Stairs: Yes: External: 3 steps; none Has following equipment at home: Ramped entry  OCCUPATION: Elementary school teacher/childcare provider  PLOF: Independent  PATIENT GOALS: "pain relief"  NEXT MD VISIT: none  OBJECTIVE:  Note: Objective measures were completed at Evaluation unless otherwise noted.  DIAGNOSTIC FINDINGS:  Lumbar x-ray 06/18/2022: IMPRESSION: 1. Mild degenerative disc disease at L3-L4. 2. Broad-based dextroscoliotic curvature.  PATIENT SURVEYS:  Modified Oswestry 17/50 = 34% moderate perceived disability   COGNITION: Overall cognitive status: Within functional limits for tasks assessed     SENSATION: Light touch: WFL  POSTURE: rounded shoulders, forward head, and pt alternating pelvic weight shift frequently throughout evaluation from left to right  PALPATION: TTP at L3-L5 SP, more tender over lateral left hip joint, tender in soft tissues over bilateral iliac  crest, soreness w/ palpation in medial adductor region mostly on right  LUMBAR ROM:   AROM eval  Flexion 30  Extension 11  Right lateral flexion WNL  Left lateral flexion "  Right rotation "  Left rotation "; Pain in left back and hip   (Blank rows = not tested)  LOWER EXTREMITY ROM:     Active  Right eval Left eval  Hip flexion WNL  Hip extension   Hip abduction   Hip adduction   Hip internal rotation   Hip external rotation   Knee flexion   Knee extension   Ankle dorsiflexion   Ankle plantarflexion    Ankle inversion    Ankle eversion     (Blank rows = not tested)  LOWER EXTREMITY MMT:    MMT Right eval Left eval  Hip flexion Grossly 4+/5  Hip extension   Hip abduction   Hip adduction   Hip internal rotation   Hip external rotation   Knee flexion   Knee extension   Ankle dorsiflexion   Ankle plantarflexion    Ankle inversion    Ankle eversion     (Blank rows = not tested)  LUMBAR SPECIAL TESTS:  SI Compression/distraction test: Negative, FABER test: + bilaterally, more intense in lumbar spine on left side, and Trendelenburg sign: + and compensated bilaterally  FUNCTIONAL TESTS:  5 times sit to stand: 13.37 sec no UE support  GAIT: Distance walked: various clinic distances Assistive device utilized: None Level of assistance: Complete Independence Comments: No LOB or obvious instability or antalgic gait.  TREATMENT DATE: 06/12/2023  Moist heat pack placed on left hip (used bolster to maintain on side hip) for therex: -LTRs x20 > ER/IR stretch x20 -Hip openers in hook-lying x20 each LE; tried in-to-out vs out-to-in sweeps w/o change in symptoms, more crepitus noted in left hip, she had some pinching in left groin but more in right  -Child's pose into cat/cow x30 seconds then 5 reps (3 cycles -thread the needle 3x30 seconds each  side > increased stretch w/ lengthened side body -Birddogs x10 each LE -Downward dog w/ pedaling x20 -Tall kneeling chops w/ 4lb ball x10 each direction  PATIENT EDUCATION:  Education details:  Discussed last scheduled visit and re-cert vs discharge (pt is agreeable to likely discharge) and need for MD follow-up for left PSIS pain that has not successfully responded to PT interventions.  Continue HEP - provided re-print. Person educated: Patient Education method: Explanation, Demonstration, Tactile cues, Verbal cues, and Handouts Education comprehension: verbalized understanding  HOME EXERCISE PROGRAM: Access Code: [email or web address] URL: [email or web address] Date: 05/01/2023 Prepared by: Marilou Showman  Exercises - Supine Bridge  - 1 x daily - 5 x weekly - 3 sets - 10 reps - Clamshell  - 1 x daily - 5 x weekly - 3 sets - 10 reps - Supine Hip Adduction Isometric with Ball  - 1 x daily - 5 x weekly - 3 sets - 10 reps - 1-2 seconds hold - Pelvic Muscle Energy Technique With Flexion and Extension  - 1 x daily - 5 x weekly - 2 sets - 5 reps - 10 seconds hold - Supine 90/90 Alternating Toe Touch  - 1 x daily - 5 x weekly - 2 sets - 10 reps - Supine Lower Trunk Rotation  - 1 x daily - 5 x weekly - 1 sets - 20 reps - Supine Sciatic Nerve Glide  - 1 x daily - 5 x weekly - 2 sets - 10 reps - Seated Hamstring Stretch  - 1 x daily - 5 x weekly - 1 sets - 2-3 reps - 45 seconds hold - Supine Posterior Pelvic Tilt  - 1 x daily - 5 x weekly - 2 sets - 10 reps - Seated Flexion Stretch  - 1 x daily - 5 x weekly - 1-2 sets - 10 reps - Thomas Stretch on Table  - 1 x daily - 5 x weekly - 1 sets - 2-3 reps - 45-60 seconds hold - Sidelying ITB Stretch  - 1 x daily - 5 x weekly - 1 sets - 2-3 reps - 30-45 seconds hold - Bird Dog  - 1 x daily - 5 x weekly - 2 sets - 10 reps  ASSESSMENT:  CLINICAL IMPRESSION: Pt has persistent left PSIS pain that may benefit from MD follow-up following likely  discharge from PT at last scheduled appt as it has not responded consistently to skilled interventions used.  She is ready for discharge to self-management regarding other mobility and pain improvements in low back and hip.  PT made singular addition to HEP this visit to further improve core engagement.  Will proceed with goal assessment at next appt.  OBJECTIVE IMPAIRMENTS: decreased activity tolerance, improper body mechanics, postural dysfunction, and pain.   ACTIVITY LIMITATIONS: carrying, lifting, and bending  PARTICIPATION LIMITATIONS: driving, community activity, and occupation  PERSONAL FACTORS: Fitness, Time since onset of injury/illness/exacerbation, and 1 comorbidity: prior left knee surgery  are also affecting patient's functional outcome.   REHAB POTENTIAL: Good  CLINICAL DECISION MAKING: Evolving/moderate complexity  EVALUATION COMPLEXITY:  Moderate   GOALS: Goals reviewed with patient? Yes  SHORT TERM GOALS: Target date: 05/25/2023  Pt will be independent and compliant with introductory strength and stretching HEP in order to maintain functional progress and improve mobility. Baseline:  Addition made 4/1 - pt is doing more than HEP (4/1) Goal status: MET  LONG TERM GOALS: Target date: 06/22/2023  Pt will be independent and compliant with advanced and finalized strength and stretching HEP in order to maintain functional progress and improve mobility. Baseline: Established on eval. Goal status: INITIAL  2.  Pt will improve modified oswestry score to </= 24% in order to demonstrate improved pain management and function. Baseline: 34% Goal status: INITIAL  3.  Patient will maintain full left back rotation without pain in order to improve general mobility tolerance. Baseline: pain on left rotation w/ normal ROM Goal status: INITIAL  PLAN:  PT FREQUENCY: 1x/week  PT DURATION: 8 weeks  PLANNED INTERVENTIONS: 97164- PT Re-evaluation, 97110-Therapeutic exercises, 97530-  Therapeutic activity, 97112- Neuromuscular re-education, 97535- Self Care, 82956- Manual therapy, [id number]- Electrical stimulation (manual), Patient/Family education, Taping, Dry Needling, Joint mobilization, Spinal mobilization, Cryotherapy, and Moist heat.  PLAN FOR NEXT SESSION: ASSESS LTGs - D/C!   Earlean Glaze, PT, DPT 06/12/2023, 2:57 PM

## 2023-06-19 ENCOUNTER — Ambulatory Visit: Admitting: Physical Therapy

## 2023-06-19 ENCOUNTER — Encounter: Payer: Self-pay | Admitting: Physical Therapy

## 2023-06-19 DIAGNOSIS — M5459 Other low back pain: Secondary | ICD-10-CM

## 2023-06-19 DIAGNOSIS — M25552 Pain in left hip: Secondary | ICD-10-CM

## 2023-06-19 DIAGNOSIS — R29898 Other symptoms and signs involving the musculoskeletal system: Secondary | ICD-10-CM

## 2023-06-19 NOTE — Therapy (Signed)
 OUTPATIENT PHYSICAL THERAPY THORACOLUMBAR TREATMENT - DISCHARGE SUMMARY   Patient Name: Maureen Lynch MRN: [id number] DOB:1985-12-07, 38 y.o., female Today's Date: 06/19/2023  PHYSICAL THERAPY DISCHARGE SUMMARY  Visits from Start of Care: 8  Current functional level related to goals / functional outcomes: See clinical impression statement.   Remaining deficits: Left SIJ pain   Education / Equipment: Plan for discharge today following goal assessment.  Encouraged ongoing dialogue with MD about pain management and alternative interventions for management of left PSIS pain that has not had much response to conservative management.  Continue HEP, walk regularly, pain management techniques like heat, ice, and rest.  Progress towards goals assessed today.   Patient agrees to discharge. Patient goals were partially met. Patient is being discharged due to maximized rehab potential.    END OF SESSION:  PT End of Session - 06/19/23 1407     Visit Number 8    Number of Visits 9   8+eval   Date for PT Re-Evaluation 06/29/23   pushed out due to possible cancellations   Authorization Type [employer]    PT Start Time 1403    PT Stop Time 1449    PT Time Calculation (min) 46 min    Activity Tolerance Patient tolerated treatment well;Patient limited by pain    Behavior During Therapy [facility] for tasks assessed/performed             Past Medical History:  Diagnosis Date   Headache    History of marijuana use    last use- 10/2014   History of trichomonal vaginitis    treated   Hx of chlamydia infection    Vaginal Pap smear, abnormal    Past Surgical History:  Procedure Laterality Date   KNEE ARTHROCENTESIS     THERAPEUTIC ABORTION     TONSILLECTOMY AND ADENOIDECTOMY     Patient Active Problem List   Diagnosis Date Noted   Mild sleep apnea 08/22/2022   Loud snoring 06/05/2022   SVD (spontaneous vaginal delivery) 06/14/2015   Periurethral laceration, delivered, current  hospitalization 06/14/2015   Obstetric vaginal laceration, delivered, current hospitalization 06/14/2015   Obstetric labial laceration, delivered, current hospitalization 06/14/2015   Normal labor 06/13/2015   Morbid obesity with BMI of 45.0-49.9, adult (HCC) 06/13/2015    PCP: Duwayne Ginsberg, NP  REFERRING PROVIDER: Duwayne Ginsberg, NP  REFERRING DIAG: M41.9 (ICD-10-CM) - Scoliosis, unspecified  Rationale for Evaluation and Treatment: Rehabilitation  THERAPY DIAG:  Other low back pain  Pain in left hip  Other symptoms and signs involving the musculoskeletal system  ONSET DATE: 2 years ago  SUBJECTIVE:  SUBJECTIVE STATEMENT: She reports she remains in pain today following her son's birthday party where she did a lot of bending, lifting and twisting.  She reports it is not much resolved today due to her not moving much yesterday.  Her left knee was throbbing following her activity and her back and hip are a dull ache.  PERTINENT HISTORY:  Vaginal delivery, mild sleep apnea, left MCL, ACL, and medial meniscal repair at 38 years old  PAIN:  Are you having pain? Yes: NPRS scale: 3-4 Pain location: left groin, hip into low back Pain description: deep ache at rest, stabbing when walking Aggravating factors: walking, standing (5-6/10) Relieving factors: rest, difficult to find positions of comfort - laying flat  PRECAUTIONS: None  RED FLAGS: None   WEIGHT BEARING RESTRICTIONS: No  FALLS:  Has patient fallen in last 6 months? No  LIVING ENVIRONMENT: Lives with: lives with their son - 16 years old Lives in: House/apartment Stairs: Yes: External: 3 steps; none Has following equipment at home: Ramped entry  OCCUPATION: Elementary school teacher/childcare provider  PLOF:  Independent  PATIENT GOALS: "pain relief"  NEXT MD VISIT: none  OBJECTIVE:  Note: Objective measures were completed at Evaluation unless otherwise noted.  DIAGNOSTIC FINDINGS:  Lumbar x-ray 06/18/2022: IMPRESSION: 1. Mild degenerative disc disease at L3-L4. 2. Broad-based dextroscoliotic curvature.  PATIENT SURVEYS:  Modified Oswestry 17/50 = 34% moderate perceived disability   COGNITION: Overall cognitive status: Within functional limits for tasks assessed     SENSATION: Light touch: WFL  POSTURE: rounded shoulders, forward head, and pt alternating pelvic weight shift frequently throughout evaluation from left to right  PALPATION: TTP at L3-L5 SP, more tender over lateral left hip joint, tender in soft tissues over bilateral iliac crest, soreness w/ palpation in medial adductor region mostly on right  LUMBAR ROM:   AROM eval  Flexion 30  Extension 11  Right lateral flexion WNL  Left lateral flexion "  Right rotation "  Left rotation "; Pain in left back and hip   (Blank rows = not tested)  LOWER EXTREMITY ROM:     Active  Right eval Left eval  Hip flexion WNL  Hip extension   Hip abduction   Hip adduction   Hip internal rotation   Hip external rotation   Knee flexion   Knee extension   Ankle dorsiflexion   Ankle plantarflexion    Ankle inversion    Ankle eversion     (Blank rows = not tested)  LOWER EXTREMITY MMT:    MMT Right eval Left eval  Hip flexion Grossly 4+/5  Hip extension   Hip abduction   Hip adduction   Hip internal rotation   Hip external rotation   Knee flexion   Knee extension   Ankle dorsiflexion   Ankle plantarflexion    Ankle inversion    Ankle eversion     (Blank rows = not tested)  LUMBAR SPECIAL TESTS:  SI Compression/distraction test: Negative, FABER test: + bilaterally, more intense in lumbar spine on left side, and Trendelenburg sign: + and compensated bilaterally  FUNCTIONAL TESTS:  5 times sit to stand: 13.37  sec no UE support  GAIT: Distance walked: various clinic distances Assistive device utilized: None Level of assistance: Complete Independence Comments: No LOB or obvious instability or antalgic gait.  TREATMENT DATE: 06/19/2023  TA: -Verbally reviewed HEP, pt politely declines reprint as one provided at prior visit. -Modified Oswestry:  12/50 = 24% low perceived disability -Left seated trunk rotation - no increase from baseline pain > standing rotation - 1-2 pt increase from baseline pain  Manual: -Sacral mobs/tilting x4 minutes and gentle gapping at bilateral SIJ w/ immediate relief for ~4 minutes before some mild radiation into left groin so mobs stopped  TE: *Placed hot pack on low back during exercises in prone: -5lb hamstring curls 2x10 each LE -5lb hip extension 2x10 each LE, mild hip rotation, removed weight on left ankle after initial reps due to severe muscle fatigue - discussed continuing to work on this deficit with similar exercises to manage functional weakness often associated with chronic pain -IR/ER stretch bilaterally x20 w/o ankle weights, pain in left hip moving out of neutral regardless of depth of ROM  PATIENT EDUCATION:  Education details:  Plan for discharge today following goal assessment.  Encouraged ongoing dialogue with MD about pain management and alternative interventions for management of left PSIS pain that has not had much response to conservative management.  Continue HEP, walk regularly, pain management techniques like heat, ice, and rest.  Progress towards goals assessed today. Person educated: Patient Education method: Explanation, Demonstration, Tactile cues, Verbal cues, and Handouts Education comprehension: verbalized understanding  HOME EXERCISE PROGRAM: Access Code: [email or web address] URL: [email or web address] Date:  05/01/2023 Prepared by: Marilou Showman  Exercises - Supine Bridge  - 1 x daily - 5 x weekly - 3 sets - 10 reps - Clamshell  - 1 x daily - 5 x weekly - 3 sets - 10 reps - Supine Hip Adduction Isometric with Ball  - 1 x daily - 5 x weekly - 3 sets - 10 reps - 1-2 seconds hold - Pelvic Muscle Energy Technique With Flexion and Extension  - 1 x daily - 5 x weekly - 2 sets - 5 reps - 10 seconds hold - Supine 90/90 Alternating Toe Touch  - 1 x daily - 5 x weekly - 2 sets - 10 reps - Supine Lower Trunk Rotation  - 1 x daily - 5 x weekly - 1 sets - 20 reps - Supine Sciatic Nerve Glide  - 1 x daily - 5 x weekly - 2 sets - 10 reps - Seated Hamstring Stretch  - 1 x daily - 5 x weekly - 1 sets - 2-3 reps - 45 seconds hold - Supine Posterior Pelvic Tilt  - 1 x daily - 5 x weekly - 2 sets - 10 reps - Seated Flexion Stretch  - 1 x daily - 5 x weekly - 1-2 sets - 10 reps - Thomas Stretch on Table  - 1 x daily - 5 x weekly - 1 sets - 2-3 reps - 45-60 seconds hold - Sidelying ITB Stretch  - 1 x daily - 5 x weekly - 1 sets - 2-3 reps - 30-45 seconds hold - Bird Dog  - 1 x daily - 5 x weekly - 2 sets - 10 reps  ASSESSMENT:  CLINICAL IMPRESSION: Pt presents for final scheduled PT appt for LTG assessment in preparation for discharge.  She continues to have persistent left SIJ area pain that has minimal response to various modalities and interventions used in PT.  She is not agreeable to use of TPDN to supplement POC and at current she is at a functional level to take other tools from prior sessions to maintain progress in home.  She has  had some response in hip and general low back pain as assessed with 10% improvement with Modified Oswestry scoring.  PT to proceed with discharge at this time with pt in agreement.  OBJECTIVE IMPAIRMENTS: decreased activity tolerance, improper body mechanics, postural dysfunction, and pain.   ACTIVITY LIMITATIONS: carrying, lifting, and bending  PARTICIPATION LIMITATIONS:  driving, community activity, and occupation  PERSONAL FACTORS: Fitness, Time since onset of injury/illness/exacerbation, and 1 comorbidity: prior left knee surgery  are also affecting patient's functional outcome.   REHAB POTENTIAL: Good  CLINICAL DECISION MAKING: Evolving/moderate complexity  EVALUATION COMPLEXITY: Moderate   GOALS: Goals reviewed with patient? Yes  SHORT TERM GOALS: Target date: 05/25/2023  Pt will be independent and compliant with introductory strength and stretching HEP in order to maintain functional progress and improve mobility. Baseline:  Addition made 4/1 - pt is doing more than HEP (4/1) Goal status: MET  LONG TERM GOALS: Target date: 06/22/2023  Pt will be independent and compliant with advanced and finalized strength and stretching HEP in order to maintain functional progress and improve mobility. Baseline:  IND and compliant (4/29) Goal status: MET  2.  Pt will improve modified oswestry score to </= 24% in order to demonstrate improved pain management and function. Baseline: 34%; 24% (4/29) Goal status: MET  3.  Patient will maintain full left back rotation without pain in order to improve general mobility tolerance. Baseline: pain on left rotation w/ normal ROM; no increase from baseline in sitting, 1-2 pt increase in standing (4/29) Goal status: NOT MET  PLAN:  PT FREQUENCY: 1x/week  PT DURATION: 8 weeks  PLANNED INTERVENTIONS: [id number]- PT Re-evaluation, 97110-Therapeutic exercises, 97530- Therapeutic activity, 97112- Neuromuscular re-education, 97535- Self Care, [id number]- Manual therapy, [id number]- Electrical stimulation (manual), Patient/Family education, Taping, Dry Needling, Joint mobilization, Spinal mobilization, Cryotherapy, and Moist heat.  PLAN FOR NEXT SESSION: N/A   Earlean Glaze, PT, DPT 06/19/2023, 2:51 PM

## 2023-12-12 ENCOUNTER — Other Ambulatory Visit: Payer: Self-pay | Admitting: Family Medicine

## 2023-12-12 ENCOUNTER — Ambulatory Visit
Admission: RE | Admit: 2023-12-12 | Discharge: 2023-12-12 | Disposition: A | Discharge status: Home / Self Care | Source: Ambulatory Visit | Attending: Family Medicine | Admitting: Family Medicine

## 2023-12-12 ENCOUNTER — Ambulatory Visit
Admission: RE | Admit: 2023-12-12 | Discharge: 2023-12-12 | Disposition: A | Source: Ambulatory Visit | Attending: Family Medicine | Admitting: Family Medicine

## 2023-12-12 DIAGNOSIS — M25552 Pain in left hip: Secondary | ICD-10-CM
